# Patient Record
Sex: Female | Born: 1945 | Race: White | Hispanic: No | Marital: Married | Smoking: Never smoker
Health system: Southern US, Community
[De-identification: ages and names within clinical notes are randomized; demographics above are authoritative.]

## PROBLEM LIST (undated history)

## (undated) DIAGNOSIS — Z86010 Personal history of colonic polyps: Secondary | ICD-10-CM

## (undated) HISTORY — PX: ABDOMINAL HYSTERECTOMY: SHX81

## (undated) HISTORY — DX: Personal history of colonic polyps: Z86.010

---

## 1998-09-10 ENCOUNTER — Other Ambulatory Visit: Admission: RE | Admit: 1998-09-10 | Discharge: 1998-09-10 | Payer: Self-pay | Admitting: Obstetrics and Gynecology

## 1999-12-03 ENCOUNTER — Other Ambulatory Visit: Admission: RE | Admit: 1999-12-03 | Discharge: 1999-12-03 | Payer: Self-pay | Admitting: Obstetrics and Gynecology

## 2001-03-14 ENCOUNTER — Other Ambulatory Visit: Admission: RE | Admit: 2001-03-14 | Discharge: 2001-03-14 | Payer: Self-pay | Admitting: Obstetrics and Gynecology

## 2004-08-31 ENCOUNTER — Ambulatory Visit: Payer: Self-pay | Admitting: Internal Medicine

## 2004-09-07 ENCOUNTER — Ambulatory Visit: Payer: Self-pay | Admitting: Internal Medicine

## 2006-04-18 ENCOUNTER — Ambulatory Visit: Payer: Self-pay | Admitting: Internal Medicine

## 2006-07-18 ENCOUNTER — Ambulatory Visit: Payer: Self-pay | Admitting: Internal Medicine

## 2007-01-16 ENCOUNTER — Ambulatory Visit: Payer: Self-pay | Admitting: Internal Medicine

## 2007-01-16 LAB — CONVERTED CEMR LAB
ALT: 17 units/L (ref 0–35)
AST: 20 units/L (ref 0–37)
Albumin: 3.5 g/dL (ref 3.5–5.2)
Alkaline Phosphatase: 76 units/L (ref 39–117)
BUN: 15 mg/dL (ref 6–23)
Basophils Absolute: 0 10*3/uL (ref 0.0–0.1)
Basophils Relative: 0.1 % (ref 0.0–1.0)
Bilirubin Urine: NEGATIVE
Blood in Urine, dipstick: NEGATIVE
CO2: 29 meq/L (ref 19–32)
Calcium: 9.3 mg/dL (ref 8.4–10.5)
Chloride: 107 meq/L (ref 96–112)
Cholesterol: 212 mg/dL (ref 0–200)
Creatinine, Ser: 0.9 mg/dL (ref 0.4–1.2)
Hemoglobin: 13.6 g/dL (ref 12.0–15.0)
MCHC: 35.9 g/dL (ref 30.0–36.0)
Monocytes Absolute: 0.5 10*3/uL (ref 0.2–0.7)
Monocytes Relative: 8.3 % (ref 3.0–11.0)
Potassium: 4.8 meq/L (ref 3.5–5.1)
Protein, U semiquant: NEGATIVE
RBC: 4.34 M/uL (ref 3.87–5.11)
RDW: 12.1 % (ref 11.5–14.6)
Specific Gravity, Urine: >=1.03
TSH: 3.82 microintl units/mL (ref 0.35–5.50)
Total Bilirubin: 0.6 mg/dL (ref 0.3–1.2)
Total CHOL/HDL Ratio: 4.2
Total Protein: 7 g/dL (ref 6.0–8.3)
Triglycerides: 139 mg/dL (ref 0–149)
Urobilinogen, UA: 0.2
VLDL: 28 mg/dL (ref 0–40)
WBC Urine, dipstick: NEGATIVE

## 2007-02-01 ENCOUNTER — Ambulatory Visit: Payer: Self-pay | Admitting: Internal Medicine

## 2007-02-01 DIAGNOSIS — Z860100 Personal history of colon polyps, unspecified: Secondary | ICD-10-CM

## 2007-02-01 HISTORY — DX: Personal history of colonic polyps: Z86.010

## 2007-02-01 HISTORY — DX: Personal history of colon polyps, unspecified: Z86.0100

## 2007-03-27 ENCOUNTER — Encounter: Payer: Self-pay | Admitting: Internal Medicine

## 2008-02-01 ENCOUNTER — Ambulatory Visit: Payer: Self-pay | Admitting: Internal Medicine

## 2008-02-01 LAB — CONVERTED CEMR LAB
Albumin: 3.5 g/dL (ref 3.5–5.2)
Bilirubin, Direct: 0.1 mg/dL (ref 0.0–0.3)
Calcium: 9.6 mg/dL (ref 8.4–10.5)
Direct LDL: 114.5 mg/dL
Eosinophils Absolute: 0.1 10*3/uL (ref 0.0–0.7)
GFR calc Af Amer: 82 mL/min
GFR calc non Af Amer: 67 mL/min
Glucose, Bld: 92 mg/dL (ref 70–99)
HCT: 39.1 % (ref 36.0–46.0)
HDL: 56.2 mg/dL (ref >39.0)
Hemoglobin: 13.7 g/dL (ref 12.0–15.0)
MCHC: 35.1 g/dL (ref 30.0–36.0)
MCV: 89.5 fL (ref 78.0–100.0)
Monocytes Absolute: 0.5 10*3/uL (ref 0.1–1.0)
Monocytes Relative: 7.2 % (ref 3.0–12.0)
Neutro Abs: 4.4 10*3/uL (ref 1.4–7.7)
Platelets: 216 10*3/uL (ref 150–400)
Potassium: 4.6 meq/L (ref 3.5–5.1)
RDW: 12.2 % (ref 11.5–14.6)
Sodium: 143 meq/L (ref 135–145)
TSH: 3.27 microintl units/mL (ref 0.35–5.50)
Total Protein: 7 g/dL (ref 6.0–8.3)

## 2008-08-05 ENCOUNTER — Encounter: Payer: Self-pay | Admitting: Internal Medicine

## 2008-08-06 ENCOUNTER — Encounter: Payer: Self-pay | Admitting: Internal Medicine

## 2008-08-06 DIAGNOSIS — S82899A Other fracture of unspecified lower leg, initial encounter for closed fracture: Secondary | ICD-10-CM | POA: Insufficient documentation

## 2009-01-24 ENCOUNTER — Ambulatory Visit: Payer: Self-pay | Admitting: Internal Medicine

## 2009-01-24 LAB — CONVERTED CEMR LAB
ALT: 16 units/L (ref 0–35)
Albumin: 3.5 g/dL (ref 3.5–5.2)
Basophils Relative: 0 % (ref 0.0–3.0)
Bilirubin, Direct: 0.1 mg/dL (ref 0.0–0.3)
CO2: 26 meq/L (ref 19–32)
Chloride: 108 meq/L (ref 96–112)
Eosinophils Relative: 1.6 % (ref 0.0–5.0)
HCT: 39.9 % (ref 36.0–46.0)
Hemoglobin: 13.6 g/dL (ref 12.0–15.0)
LDL Cholesterol: 116 mg/dL — ABNORMAL HIGH (ref 0–99)
Lymphs Abs: 1.9 10*3/uL (ref 0.7–4.0)
MCV: 92.7 fL (ref 78.0–100.0)
Monocytes Absolute: 0.3 10*3/uL (ref 0.1–1.0)
Neutro Abs: 3.5 10*3/uL (ref 1.4–7.7)
Neutrophils Relative %: 59.6 % (ref 43.0–77.0)
Nitrite: NEGATIVE
Potassium: 4.4 meq/L (ref 3.5–5.1)
RBC: 4.3 M/uL (ref 3.87–5.11)
Sodium: 142 meq/L (ref 135–145)
Specific Gravity, Urine: 1.025
Total CHOL/HDL Ratio: 3
Total Protein: 7 g/dL (ref 6.0–8.3)
Urobilinogen, UA: 0.2
WBC: 5.8 10*3/uL (ref 4.5–10.5)

## 2009-02-03 ENCOUNTER — Ambulatory Visit: Payer: Self-pay | Admitting: Internal Medicine

## 2009-02-12 ENCOUNTER — Encounter (INDEPENDENT_AMBULATORY_CARE_PROVIDER_SITE_OTHER): Payer: Self-pay | Admitting: *Deleted

## 2010-02-13 ENCOUNTER — Encounter: Payer: Self-pay | Admitting: Internal Medicine

## 2010-04-28 ENCOUNTER — Encounter: Payer: Self-pay | Admitting: Internal Medicine

## 2010-04-28 ENCOUNTER — Ambulatory Visit (INDEPENDENT_AMBULATORY_CARE_PROVIDER_SITE_OTHER): Payer: PRIVATE HEALTH INSURANCE | Admitting: Internal Medicine

## 2010-04-28 VITALS — BP 140/90 | Temp 98.2°F | Ht 63.5 in | Wt 170.0 lb

## 2010-04-28 DIAGNOSIS — J209 Acute bronchitis, unspecified: Secondary | ICD-10-CM

## 2010-04-28 MED ORDER — HYDROCODONE-HOMATROPINE 5-1.5 MG/5ML PO SYRP: 5.0000 mL | ORAL_SOLUTION | Freq: Four times a day (QID) | ORAL | Status: AC | PRN

## 2010-04-28 MED ORDER — DOXYCYCLINE HYCLATE 100 MG PO TABS: 100.0000 mg | ORAL_TABLET | Freq: Two times a day (BID) | ORAL | Status: AC

## 2010-04-28 NOTE — Progress Notes (Signed)
  Subjective:    Patient ID: Susan Lester, female    DOB: Jun 04, 1945, 65 y.o.   MRN: [id number]  HPI  65 year old patient who presented with a 3 week history of sinus and chest congestion.  She is having more productive cough yielding yellow sputum.  No definite fever, chills, or shortness of breath.  Symptoms have worsened steadily over the past 3 weeks.  Cough is interfering with sleep.  Her husband has a similar illness and is on antibiotic therapy    Review of Systems  Constitutional: Positive for fatigue.  HENT: Positive for congestion. Negative for hearing loss, sore throat, rhinorrhea, dental problem, sinus pressure and tinnitus.   Eyes: Negative for pain, discharge and visual disturbance.  Respiratory: Positive for cough. Negative for shortness of breath.   Cardiovascular: Negative for chest pain, palpitations and leg swelling.  Gastrointestinal: Negative for nausea, vomiting, abdominal pain, diarrhea, constipation, blood in stool and abdominal distention.  Genitourinary: Negative for dysuria, urgency, frequency, hematuria, flank pain, vaginal bleeding, vaginal discharge, difficulty urinating, vaginal pain and pelvic pain.  Musculoskeletal: Negative for joint swelling, arthralgias and gait problem.  Skin: Negative for rash.  Neurological: Negative for dizziness, syncope, speech difficulty, weakness, numbness and headaches.  Hematological: Negative for adenopathy.  Psychiatric/Behavioral: Negative for behavioral problems, dysphoric mood and agitation. The patient is not nervous/anxious.        Objective:   Physical Exam  Constitutional: She is oriented to person, place, and time. She appears well-developed and well-nourished. No distress.  HENT:  Head: Normocephalic.  Right Ear: External ear normal.  Left Ear: External ear normal.  Nose: Nose normal.  Mouth/Throat: Oropharynx is clear and moist.  Eyes: Conjunctivae and EOM are normal. Pupils are equal, round, and reactive to  light.  Neck: Normal range of motion. Neck supple. No thyromegaly present.  Cardiovascular: Normal rate, regular rhythm, normal heart sounds and intact distal pulses.   Pulmonary/Chest: Effort normal and breath sounds normal. No respiratory distress. She has no wheezes.       Scattered coarse rhonchi bilaterally  Abdominal: Soft. Bowel sounds are normal. She exhibits no mass. There is no tenderness.  Musculoskeletal: Normal range of motion.  Lymphadenopathy:    She has no cervical adenopathy.  Neurological: She is alert and oriented to person, place, and time.  Skin: Skin is warm and dry. No rash noted.  Psychiatric: She has a normal mood and affect. Her behavior is normal.          Assessment & Plan:  Acute bronchitis

## 2010-04-28 NOTE — Patient Instructions (Signed)
Get plenty of rest, Drink lots of  clear liquids, and use Tylenol or ibuprofen for fever and discomfort.    Call or return to clinic prn if these symptoms worsen or fail to improve as anticipated.  

## 2010-06-30 ENCOUNTER — Encounter: Payer: Self-pay | Admitting: Internal Medicine

## 2011-03-03 LAB — HM DEXA SCAN

## 2011-03-03 LAB — HM MAMMOGRAPHY

## 2011-03-08 ENCOUNTER — Encounter: Payer: Self-pay | Admitting: Internal Medicine

## 2011-03-22 ENCOUNTER — Encounter: Payer: Self-pay | Admitting: Internal Medicine

## 2011-08-03 ENCOUNTER — Other Ambulatory Visit (INDEPENDENT_AMBULATORY_CARE_PROVIDER_SITE_OTHER): Payer: Medicare Other

## 2011-08-03 DIAGNOSIS — Z0000 Encounter for general adult medical examination without abnormal findings: Secondary | ICD-10-CM

## 2011-08-03 DIAGNOSIS — Z79899 Other long term (current) drug therapy: Secondary | ICD-10-CM

## 2011-08-03 LAB — CBC WITH DIFFERENTIAL/PLATELET
Basophils Absolute: 0 10*3/uL (ref 0.0–0.1)
Eosinophils Absolute: 0.1 10*3/uL (ref 0.0–0.7)
HCT: 41 % (ref 36.0–46.0)
Hemoglobin: 13.6 g/dL (ref 12.0–15.0)
Lymphs Abs: 2 10*3/uL (ref 0.7–4.0)
MCHC: 33.1 g/dL (ref 30.0–36.0)
Neutro Abs: 4.2 10*3/uL (ref 1.4–7.7)
RDW: 13.9 % (ref 11.5–14.6)

## 2011-08-03 LAB — TSH: TSH: 3.84 u[IU]/mL (ref 0.35–5.50)

## 2011-08-03 LAB — POCT URINALYSIS DIPSTICK
Bilirubin, UA: NEGATIVE
Ketones, UA: NEGATIVE
Protein, UA: NEGATIVE
Spec Grav, UA: 1.025

## 2011-08-03 LAB — BASIC METABOLIC PANEL
CO2: 26 mEq/L (ref 19–32)
Glucose, Bld: 71 mg/dL (ref 70–99)
Potassium: 4.2 mEq/L (ref 3.5–5.1)

## 2011-08-03 LAB — LIPID PANEL: HDL: 62.9 mg/dL (ref >39.00)

## 2011-08-03 LAB — HEPATIC FUNCTION PANEL
AST: 16 U/L (ref 0–37)
Albumin: 3.6 g/dL (ref 3.5–5.2)

## 2011-08-03 LAB — LDL CHOLESTEROL, DIRECT: Direct LDL: 132.8 mg/dL

## 2011-08-13 ENCOUNTER — Ambulatory Visit (INDEPENDENT_AMBULATORY_CARE_PROVIDER_SITE_OTHER): Payer: Medicare Other | Admitting: Internal Medicine

## 2011-08-13 ENCOUNTER — Encounter: Payer: Self-pay | Admitting: Internal Medicine

## 2011-08-13 VITALS — BP 132/82 | HR 66 | Temp 97.8°F | Resp 20 | Ht 63.5 in | Wt 178.0 lb

## 2011-08-13 DIAGNOSIS — Z86010 Personal history of colonic polyps: Secondary | ICD-10-CM

## 2011-08-13 DIAGNOSIS — M722 Plantar fascial fibromatosis: Secondary | ICD-10-CM

## 2011-08-13 DIAGNOSIS — Z0000 Encounter for general adult medical examination without abnormal findings: Secondary | ICD-10-CM

## 2011-08-13 DIAGNOSIS — N951 Menopausal and female climacteric states: Secondary | ICD-10-CM

## 2011-08-13 DIAGNOSIS — E669 Obesity, unspecified: Secondary | ICD-10-CM

## 2011-08-13 NOTE — Progress Notes (Signed)
Subjective:    Patient ID: Susan Lester, female    DOB: [birth year], 65 y.o.   MRN: [id number]  HPI  66 year old patient who is seen today for a preventive health examination. She is followed by gynecology and GI. She has a history of colonic polyps and menopausal syndrome. She is also followed by podiatry do to plantar fasciitis in general she is doing quite well. No concerns or complaints today.  Past Medical History  Diagnosis Date  . COLONIC POLYPS, HX OF 02/01/2007    History   Social History  . Marital Status: Married    Spouse Name: N/A    Number of Children: N/A  . Years of Education: N/A   Occupational History  . Not on file.   Social History Main Topics  . Smoking status: Never Smoker   . Smokeless tobacco: Not on file  . Alcohol Use: Not on file  . Drug Use: Not on file  . Sexually Active: Not on file   Other Topics Concern  . Not on file   Social History Narrative  . No narrative on file    Past Surgical History  Procedure Date  . Abdominal hysterectomy     Family History  Problem Relation Age of Onset  . Heart disease Mother   . Hypertension Mother   . Cancer Father     lung ca  . COPD Father     No Known Allergies  Current Outpatient Prescriptions on File Prior to Visit  Medication Sig Dispense Refill  . estradiol (ESTRACE) 1 MG tablet         BP 132/82  Pulse 66  Temp(Src) 97.8 F (36.6 C) (Oral)  Resp 20  Ht 5' 3.5" (1.613 m)  Wt 178 lb (80.74 kg)  BMI 31.04 kg/m2  SpO2 98%  1. Risk factors, based on past  M,S,F history-  no significant cardiovascular risk factors  2.  Physical activities: Fairly sedentary. No exercise limitations but presently hampered by plantar fasciitis  3.  Depression/mood: No history depression or mood disorder  4.  Hearing: No deficits  5.  ADL's: Independent in all aspects of daily living  6.  Fall risk: Low  7.  Home safety: No problems identified  8.  Height weight, and visual acuity; height  and weight stable no change in visual acuity 9.  Counseling:  10. Lab orders based on risk factors: Laboratory profile reviewed and discussed  11. Referral : Not appropriate at this time. Has had a mammogram and bone density study within the past year. Is followed by gynecology annually  12. Care plan:  Low-salt low-calorie diet regular exercise and weight loss all encouraged  13. Cognitive assessment: Alert and oriented normal affect. No cognitive dysfunction        Review of Systems  Constitutional: Negative for fever, appetite change, fatigue and unexpected weight change.  HENT: Negative for hearing loss, ear pain, nosebleeds, congestion, sore throat, mouth sores, trouble swallowing, neck stiffness, dental problem, voice change, sinus pressure and tinnitus.   Eyes: Negative for photophobia, pain, redness and visual disturbance.  Respiratory: Negative for cough, chest tightness and shortness of breath.   Cardiovascular: Negative for chest pain, palpitations and leg swelling.  Gastrointestinal: Negative for nausea, vomiting, abdominal pain, diarrhea, constipation, blood in stool, abdominal distention and rectal pain.  Genitourinary: Negative for dysuria, urgency, frequency, hematuria, flank pain, vaginal bleeding, vaginal discharge, difficulty urinating, genital sores, vaginal pain, menstrual problem and pelvic pain.  Musculoskeletal: Negative for back  pain and arthralgias (Foot pain).  Skin: Negative for rash.  Neurological: Negative for dizziness, syncope, speech difficulty, weakness, light-headedness, numbness and headaches.  Hematological: Negative for adenopathy. Does not bruise/bleed easily.  Psychiatric/Behavioral: Negative for suicidal ideas, behavioral problems, self-injury, dysphoric mood and agitation. The patient is not nervous/anxious.        Objective:   Physical Exam  Constitutional: She is oriented to person, place, and time. She appears well-developed and  well-nourished.       Overweight Blood pressure 130/80  HENT:  Head: Normocephalic and atraumatic.  Right Ear: External ear normal.  Left Ear: External ear normal.  Mouth/Throat: Oropharynx is clear and moist.  Eyes: Conjunctivae and EOM are normal.  Neck: Normal range of motion. Neck supple. No JVD present. No thyromegaly present.  Cardiovascular: Normal rate, regular rhythm, normal heart sounds and intact distal pulses.   No murmur heard. Pulmonary/Chest: Effort normal and breath sounds normal. She has no wheezes. She has no rales.  Abdominal: Soft. Bowel sounds are normal. She exhibits no distension and no mass. There is no tenderness. There is no rebound and no guarding.  Musculoskeletal: Normal range of motion. She exhibits no edema and no tenderness.  Neurological: She is alert and oriented to person, place, and time. She has normal reflexes. No cranial nerve deficit. She exhibits normal muscle tone. Coordination normal.  Skin: Skin is warm and dry. No rash noted.  Psychiatric: She has a normal mood and affect. Her behavior is normal.          Assessment & Plan:   Preventive health examination    Preventive health examination  Exogenous obesity Menopausal syndrome Plantar fasciitis  Exercise weight loss encouraged Recheck one year

## 2011-08-13 NOTE — Patient Instructions (Signed)
It is important that you exercise regularly, at least 20 minutes 3 to 4 times per week.  If you develop chest pain or shortness of breath seek  medical attention.  You need to lose weight.  Consider a lower calorie diet and regular exercise.  Return in one year for follow-up DASH Diet The DASH diet stands for "Dietary Approaches to Stop Hypertension." It is a healthy eating plan that has been shown to reduce high blood pressure (hypertension) in as little as 14 days, while also possibly providing other significant health benefits. These other health benefits include reducing the risk of breast cancer after menopause and reducing the risk of type 2 diabetes, heart disease, colon cancer, and stroke. Health benefits also include weight loss and slowing kidney failure in patients with chronic kidney disease.   DIET GUIDELINES  Limit salt (sodium). Your diet should contain less than 1500 mg of sodium daily.   Limit refined or processed carbohydrates. Your diet should include mostly whole grains. Desserts and added sugars should be used sparingly.   Include small amounts of heart-healthy fats. These types of fats include nuts, oils, and tub margarine. Limit saturated and trans fats. These fats have been shown to be harmful in the body.  CHOOSING FOODS   The following food groups are based on a 2000 calorie diet. See your Registered Dietitian for individual calorie needs. Grains and Grain Products (6 to 8 servings daily)  Eat More Often: Whole-wheat bread, brown rice, whole-grain or wheat pasta, quinoa, popcorn without added fat or salt (air popped).   Eat Less Often: White bread, white pasta, white rice, cornbread.  Vegetables (4 to 5 servings daily)  Eat More Often: Fresh, frozen, and canned vegetables. Vegetables may be raw, steamed, roasted, or grilled with a minimal amount of fat.   Eat Less Often/Avoid: Creamed or fried vegetables. Vegetables in a cheese sauce.  Fruit (4 to 5 servings  daily)  Eat More Often: All fresh, canned (in natural juice), or frozen fruits. Dried fruits without added sugar. One hundred percent fruit juice ( cup [237 mL] daily).   Eat Less Often: Dried fruits with added sugar. Canned fruit in light or heavy syrup.  [employer], Fish, and Poultry (2 servings or less daily. One serving is 3 to 4 oz [85-114 g]).  Eat More Often: Ninety percent or leaner ground beef, tenderloin, sirloin. Round cuts of beef, chicken breast, [place] breast. All fish. Grill, bake, or broil your meat. Nothing should be fried.   Eat Less Often/Avoid: Fatty cuts of meat, [place], or chicken leg, thigh, or wing. Fried cuts of meat or fish.  Dairy (2 to 3 servings)  Eat More Often: Low-fat or fat-free milk, low-fat plain or light yogurt, reduced-fat or part-skim cheese.   Eat Less Often/Avoid: Milk (whole, 2%, skim, or chocolate). Whole milk yogurt. Full-fat cheeses.  Nuts, Seeds, and Legumes (4 to 5 servings per week)  Eat More Often: All without added salt.   Eat Less Often/Avoid: Salted nuts and seeds, canned beans with added salt.  Fats and Sweets (limited)  Eat More Often: Vegetable oils, tub margarines without trans fats, sugar-free gelatin. Mayonnaise and salad dressings.   Eat Less Often/Avoid: Coconut oils, palm oils, butter, stick margarine, cream, half and half, cookies, candy, pie.  FOR MORE INFORMATION The Dash Diet Eating Plan: www.dashdiet.org Document Released: 02/18/2011 Document Reviewed: 02/08/2011 [facility] Patient Information 2012 [place], [place].

## 2012-08-08 ENCOUNTER — Other Ambulatory Visit: Payer: Medicare Other

## 2012-08-14 ENCOUNTER — Encounter: Payer: Medicare Other | Admitting: Internal Medicine

## 2012-09-05 ENCOUNTER — Encounter: Payer: Self-pay | Admitting: Internal Medicine

## 2012-09-05 ENCOUNTER — Ambulatory Visit (INDEPENDENT_AMBULATORY_CARE_PROVIDER_SITE_OTHER): Payer: Medicare Other | Admitting: Internal Medicine

## 2012-09-05 VITALS — BP 140/86 | HR 63 | Temp 97.8°F | Resp 20 | Ht 63.25 in | Wt 187.0 lb

## 2012-09-05 DIAGNOSIS — Z23 Encounter for immunization: Secondary | ICD-10-CM

## 2012-09-05 DIAGNOSIS — Z860100 Personal history of colon polyps, unspecified: Secondary | ICD-10-CM

## 2012-09-05 DIAGNOSIS — Z0000 Encounter for general adult medical examination without abnormal findings: Secondary | ICD-10-CM

## 2012-09-05 LAB — CBC WITH DIFFERENTIAL/PLATELET
Basophils Relative: 0.3 % (ref 0.0–3.0)
Eosinophils Relative: 1.8 % (ref 0.0–5.0)
HCT: 39.4 % (ref 36.0–46.0)
MCV: 88.8 fl (ref 78.0–100.0)
Monocytes Absolute: 0.4 10*3/uL (ref 0.1–1.0)
Monocytes Relative: 5.4 % (ref 3.0–12.0)
Neutrophils Relative %: 65.3 % (ref 43.0–77.0)
RBC: 4.44 Mil/uL (ref 3.87–5.11)
WBC: 6.9 10*3/uL (ref 4.5–10.5)

## 2012-09-05 LAB — COMPREHENSIVE METABOLIC PANEL
Albumin: 3.7 g/dL (ref 3.5–5.2)
Alkaline Phosphatase: 60 U/L (ref 39–117)
BUN: 14 mg/dL (ref 6–23)
CO2: 27 mEq/L (ref 19–32)
GFR: 62.28 mL/min (ref >60.00)
Glucose, Bld: 116 mg/dL — ABNORMAL HIGH (ref 70–99)
Total Bilirubin: 0.8 mg/dL (ref 0.3–1.2)

## 2012-09-05 LAB — TSH: TSH: 3.5 u[IU]/mL (ref 0.35–5.50)

## 2012-09-05 LAB — LIPID PANEL
Cholesterol: 192 mg/dL (ref 0–200)
HDL: 63.7 mg/dL (ref >39.00)
VLDL: 26 mg/dL (ref 0.0–40.0)

## 2012-09-05 NOTE — Progress Notes (Signed)
Patient ID: Susan Lester, female   DOB: [birth year], 67 y.o.   MRN: [id number]  Subjective:    Patient ID: Susan Lester, female    DOB: 1945/10/15, 67 y.o.   MRN: [id number]  HPI  67 year old patient who is seen today for a preventive health examination. She is followed by gynecology and GI. She has a history of colonic polyps and menopausal syndrome.  In general she is doing quite well. No concerns or complaints   Wt Readings from Last 3 Encounters:  09/05/12 187 lb (84.823 kg)  08/13/11 178 lb (80.74 kg)  04/28/10 170 lb (77.111 kg)    Allergies:  No Known Drug Allergies   Past History:  Past Medical History:  Reviewed history from 02/01/2007 and no changes required.  menopausal syndrome  Colonic polyps, hx of   Past Surgical History:  Hysterectomy  gravida two, para two, aborta zero  colonoscopy 1/09  2012 ([facility])  Family History:  Reviewed history from 02/01/2007 and no changes required.  father died age 37, lung cancer, history of COPD  mother died age 50, coronary artery disease, history, hypertension  3 brothers, one accidental death   Social History:  Reviewed history from 02/01/2007 and no changes required.  Married      Past Medical History  Diagnosis Date  . COLONIC POLYPS, HX OF 02/01/2007    History   Social History  . Marital Status: Married    Spouse Name: N/A    Number of Children: N/A  . Years of Education: N/A   Occupational History  . Not on file.   Social History Main Topics  . Smoking status: Never Smoker   . Smokeless tobacco: Not on file  . Alcohol Use: Not on file  . Drug Use: Not on file  . Sexually Active: Not on file   Other Topics Concern  . Not on file   Social History Narrative  . No narrative on file    Past Surgical History  Procedure Laterality Date  . Abdominal hysterectomy      Family History  Problem Relation Age of Onset  . Heart disease Mother   . Hypertension Mother   . Cancer Father     lung ca   . COPD Father     No Known Allergies  Current Outpatient Prescriptions on File Prior to Visit  Medication Sig Dispense Refill  . estradiol (ESTRACE) 1 MG tablet        No current facility-administered medications on file prior to visit.    There were no vitals taken for this visit.  1. Risk factors, based on past  M,S,F history-  no significant cardiovascular risk factors  2.  Physical activities: Fairly sedentary. No exercise limitations but presently hampered by plantar fasciitis  3.  Depression/mood: No history depression or mood disorder  4.  Hearing: No deficits  5.  ADL's: Independent in all aspects of daily living  6.  Fall risk: Low  7.  Home safety: No problems identified  8.  Height weight, and visual acuity; weight continues to increase;  no change in visual acuity 9.  Counseling: Regular exercise weight loss all encouraged  10. Lab orders based on risk factors: Laboratory profile reviewed and discussed  11. Referral : Not appropriate at this time. Has had a mammogram and bone density study within the past year. Is followed by gynecology annually  12. Care plan:  Low-salt low-calorie diet regular exercise and weight loss all encouraged  13. Cognitive  assessment: Alert and oriented normal affect. No cognitive dysfunction        Review of Systems  Constitutional: Negative for fever, appetite change, fatigue and unexpected weight change.  HENT: Negative for hearing loss, ear pain, nosebleeds, congestion, sore throat, mouth sores, trouble swallowing, neck stiffness, dental problem, voice change, sinus pressure and tinnitus.   Eyes: Negative for photophobia, pain, redness and visual disturbance.  Respiratory: Negative for cough, chest tightness and shortness of breath.   Cardiovascular: Negative for chest pain, palpitations and leg swelling.  Gastrointestinal: Negative for nausea, vomiting, abdominal pain, diarrhea, constipation, blood in stool, abdominal  distention and rectal pain.  Genitourinary: Negative for dysuria, urgency, frequency, hematuria, flank pain, vaginal bleeding, vaginal discharge, difficulty urinating, genital sores, vaginal pain, menstrual problem and pelvic pain.  Musculoskeletal: Negative for back pain and arthralgias (Foot pain).  Skin: Negative for rash.  Neurological: Negative for dizziness, syncope, speech difficulty, weakness, light-headedness, numbness and headaches.  Hematological: Negative for adenopathy. Does not bruise/bleed easily.  Psychiatric/Behavioral: Negative for suicidal ideas, behavioral problems, self-injury, dysphoric mood and agitation. The patient is not nervous/anxious.        Objective:   Physical Exam  Constitutional: She is oriented to person, place, and time. She appears well-developed and well-nourished.  Overweight Blood pressure 130/80  HENT:  Head: Normocephalic and atraumatic.  Right Ear: External ear normal.  Left Ear: External ear normal.  Mouth/Throat: Oropharynx is clear and moist.  Eyes: Conjunctivae and EOM are normal.  Neck: Normal range of motion. Neck supple. No JVD present. No thyromegaly present.  Cardiovascular: Normal rate, regular rhythm, normal heart sounds and intact distal pulses.   No murmur heard. Pulmonary/Chest: Effort normal and breath sounds normal. She has no wheezes. She has no rales.  Abdominal: Soft. Bowel sounds are normal. She exhibits no distension and no mass. There is no tenderness. There is no rebound and no guarding.  Musculoskeletal: Normal range of motion. She exhibits no edema and no tenderness.  Neurological: She is alert and oriented to person, place, and time. She has normal reflexes. No cranial nerve deficit. She exhibits normal muscle tone. Coordination normal.  Skin: Skin is warm and dry. No rash noted.  Psychiatric: She has a normal mood and affect. Her behavior is normal.          Assessment & Plan:   Preventive health examination     Preventive health examination  Exogenous obesity with weight gain Menopausal syndrome   Exercise weight loss encouraged; dietary referral offered Recheck one year

## 2012-09-05 NOTE — Patient Instructions (Signed)
It is important that you exercise regularly, at least 20 minutes 3 to 4 times per week.  If you develop chest pain or shortness of breath seek  medical attention.  You need to lose weight.  Consider a lower calorie diet and regular exercise.DASH Diet The DASH diet stands for "Dietary Approaches to Stop Hypertension." It is a healthy eating plan that has been shown to reduce high blood pressure (hypertension) in as little as 14 days, while also possibly providing other significant health benefits. These other health benefits include reducing the risk of breast cancer after menopause and reducing the risk of type 2 diabetes, heart disease, colon cancer, and stroke. Health benefits also include weight loss and slowing kidney failure in patients with chronic kidney disease.  DIET GUIDELINES  Limit salt (sodium). Your diet should contain less than 1500 mg of sodium daily.  Limit refined or processed carbohydrates. Your diet should include mostly whole grains. Desserts and added sugars should be used sparingly.  Include small amounts of heart-healthy fats. These types of fats include nuts, oils, and tub margarine. Limit saturated and trans fats. These fats have been shown to be harmful in the body. CHOOSING FOODS  The following food groups are based on a 2000 calorie diet. See your Registered Dietitian for individual calorie needs. Grains and Grain Products (6 to 8 servings daily)  Eat More Often: Whole-wheat bread, brown rice, whole-grain or wheat pasta, quinoa, popcorn without added fat or salt (air popped).  Eat Less Often: White bread, white pasta, white rice, cornbread. Vegetables (4 to 5 servings daily)  Eat More Often: Fresh, frozen, and canned vegetables. Vegetables may be raw, steamed, roasted, or grilled with a minimal amount of fat.  Eat Less Often/Avoid: Creamed or fried vegetables. Vegetables in a cheese sauce. Fruit (4 to 5 servings daily)  Eat More Often: All fresh, canned (in  natural juice), or frozen fruits. Dried fruits without added sugar. One hundred percent fruit juice ( cup [237 mL] daily).  Eat Less Often: Dried fruits with added sugar. Canned fruit in light or heavy syrup. [employer], Fish, and Poultry (2 servings or less daily. One serving is 3 to 4 oz [85-114 g]).  Eat More Often: Ninety percent or leaner ground beef, tenderloin, sirloin. Round cuts of beef, chicken breast, [place] breast. All fish. Grill, bake, or broil your meat. Nothing should be fried.  Eat Less Often/Avoid: Fatty cuts of meat, [place], or chicken leg, thigh, or wing. Fried cuts of meat or fish. Dairy (2 to 3 servings)  Eat More Often: Low-fat or fat-free milk, low-fat plain or light yogurt, reduced-fat or part-skim cheese.  Eat Less Often/Avoid: Milk (whole, 2%).Whole milk yogurt. Full-fat cheeses. Nuts, Seeds, and Legumes (4 to 5 servings per week)  Eat More Often: All without added salt.  Eat Less Often/Avoid: Salted nuts and seeds, canned beans with added salt. Fats and Sweets (limited)  Eat More Often: Vegetable oils, tub margarines without trans fats, sugar-free gelatin. Mayonnaise and salad dressings.  Eat Less Often/Avoid: Coconut oils, palm oils, butter, stick margarine, cream, half and half, cookies, candy, pie. FOR MORE INFORMATION The Dash Diet Eating Plan: www.dashdiet.org Document Released: 02/18/2011 Document Revised: 05/24/2011 Document Reviewed: 02/18/2011 [facility] Patient Information 2014 [place], [place].

## 2013-09-05 ENCOUNTER — Ambulatory Visit (INDEPENDENT_AMBULATORY_CARE_PROVIDER_SITE_OTHER): Payer: Medicare Other | Admitting: Internal Medicine

## 2013-09-05 ENCOUNTER — Encounter: Payer: Self-pay | Admitting: Internal Medicine

## 2013-09-05 VITALS — BP 140/86 | HR 61 | Temp 97.9°F | Resp 20 | Ht 63.5 in | Wt 173.0 lb

## 2013-09-05 DIAGNOSIS — Z0000 Encounter for general adult medical examination without abnormal findings: Secondary | ICD-10-CM

## 2013-09-05 DIAGNOSIS — Z86010 Personal history of colonic polyps: Secondary | ICD-10-CM

## 2013-09-05 DIAGNOSIS — E669 Obesity, unspecified: Secondary | ICD-10-CM

## 2013-09-05 DIAGNOSIS — N951 Menopausal and female climacteric states: Secondary | ICD-10-CM

## 2013-09-05 DIAGNOSIS — Z23 Encounter for immunization: Secondary | ICD-10-CM

## 2013-09-05 LAB — CBC WITH DIFFERENTIAL/PLATELET
BASOS PCT: 0.3 % (ref 0.0–3.0)
Basophils Absolute: 0 10*3/uL (ref 0.0–0.1)
EOS ABS: 0.1 10*3/uL (ref 0.0–0.7)
Eosinophils Relative: 2.1 % (ref 0.0–5.0)
HEMATOCRIT: 40.5 % (ref 36.0–46.0)
HEMOGLOBIN: 13.5 g/dL (ref 12.0–15.0)
LYMPHS ABS: 2.2 10*3/uL (ref 0.7–4.0)
Lymphocytes Relative: 32.4 % (ref 12.0–46.0)
MCHC: 33.4 g/dL (ref 30.0–36.0)
MCV: 89.2 fl (ref 78.0–100.0)
MONO ABS: 0.4 10*3/uL (ref 0.1–1.0)
Monocytes Relative: 6.1 % (ref 3.0–12.0)
NEUTROS ABS: 4.1 10*3/uL (ref 1.4–7.7)
Neutrophils Relative %: 59.1 % (ref 43.0–77.0)
Platelets: 219 10*3/uL (ref 150.0–400.0)
RBC: 4.54 Mil/uL (ref 3.87–5.11)
RDW: 13.3 % (ref 11.5–15.5)
WBC: 6.9 10*3/uL (ref 4.0–10.5)

## 2013-09-05 LAB — COMPREHENSIVE METABOLIC PANEL
ALT: 13 U/L (ref 0–35)
AST: 18 U/L (ref 0–37)
Albumin: 3.8 g/dL (ref 3.5–5.2)
Alkaline Phosphatase: 66 U/L (ref 39–117)
BILIRUBIN TOTAL: 0.7 mg/dL (ref 0.2–1.2)
BUN: 18 mg/dL (ref 6–23)
CO2: 28 mEq/L (ref 19–32)
CREATININE: 1 mg/dL (ref 0.4–1.2)
Calcium: 9 mg/dL (ref 8.4–10.5)
Chloride: 105 mEq/L (ref 96–112)
GFR: 59.91 mL/min — ABNORMAL LOW (ref >60.00)
Glucose, Bld: 107 mg/dL — ABNORMAL HIGH (ref 70–99)
Potassium: 4.5 mEq/L (ref 3.5–5.1)
Sodium: 139 mEq/L (ref 135–145)
Total Protein: 7.2 g/dL (ref 6.0–8.3)

## 2013-09-05 LAB — LIPID PANEL
Cholesterol: 204 mg/dL — ABNORMAL HIGH (ref 0–200)
HDL: 64 mg/dL (ref >39.00)
LDL Cholesterol: 117 mg/dL — ABNORMAL HIGH (ref 0–99)
NonHDL: 140
TRIGLYCERIDES: 113 mg/dL (ref 0.0–149.0)
Total CHOL/HDL Ratio: 3
VLDL: 22.6 mg/dL (ref 0.0–40.0)

## 2013-09-05 LAB — POCT URINALYSIS DIPSTICK
Bilirubin, UA: NEGATIVE
Glucose, UA: NEGATIVE
Ketones, UA: NEGATIVE
Leukocytes, UA: NEGATIVE
Nitrite, UA: NEGATIVE
Protein, UA: NEGATIVE
RBC UA: NEGATIVE
SPEC GRAV UA: 1.025
Urobilinogen, UA: 0.2
pH, UA: 5.5

## 2013-09-05 LAB — TSH: TSH: 3.23 u[IU]/mL (ref 0.35–4.50)

## 2013-09-05 MED ORDER — ESTRADIOL 1 MG PO TABS: 1.0000 mg | ORAL_TABLET | Freq: Every day | ORAL | Status: DC

## 2013-09-05 NOTE — Progress Notes (Signed)
Patient ID: Susan Lester, female   DOB: Nov 29, 1945, 68 y.o.   MRN: [id number]  Subjective:    Patient ID: Susan Lester, female    DOB: February 26, 1946, 68 y.o.   MRN: [id number]  HPI 80 -year-old patient who is seen today for a preventive health examination.  She is followed by gynecology and GI. She has a history of colonic polyps and menopausal syndrome.  In general she is doing quite well. No concerns or complaints.  She has given herself multiple trials over the years of discontinuing HRT, but continues to have significant vasomotor symptoms.  Wt Readings from Last 3 Encounters:  09/05/13 173 lb (78.472 kg)  09/05/12 187 lb (84.823 kg)  08/13/11 178 lb (80.74 kg)    Allergies:  No Known Drug Allergies   Past History:  Past Medical History:   menopausal syndrome  Colonic polyps, hx of   Past Surgical History:  Hysterectomy  gravida two, para two, aborta zero  colonoscopy 1/09  2012 ([facility])  Family History:   father died age 59, lung cancer, history of COPD  mother died age 46, coronary artery disease, history, hypertension  3 brothers, one accidental death   Social History:   Married      Past Medical History  Diagnosis Date  . COLONIC POLYPS, HX OF 02/01/2007    History   Social History  . Marital Status: Married    Spouse Name: N/A    Number of Children: N/A  . Years of Education: N/A   Occupational History  . Not on file.   Social History Main Topics  . Smoking status: Never Smoker   . Smokeless tobacco: Not on file  . Alcohol Use: Not on file  . Drug Use: Not on file  . Sexual Activity: Not on file   Other Topics Concern  . Not on file   Social History Narrative  . No narrative on file    Past Surgical History  Procedure Laterality Date  . Abdominal hysterectomy      Family History  Problem Relation Age of Onset  . Heart disease Mother   . Hypertension Mother   . Cancer Father     lung ca  . COPD Father     No Known  Allergies  No current outpatient prescriptions on file prior to visit.   No current facility-administered medications on file prior to visit.    BP 140/86  Pulse 61  Temp(Src) 97.9 F (36.6 C) (Oral)  Resp 20  Ht 5' 3.5" (1.613 m)  Wt 173 lb (78.472 kg)  BMI 30.16 kg/m2  SpO2 97%  1. Risk factors, based on past  M,S,F history-  no significant cardiovascular risk factors  2.  Physical activities: Fairly sedentary. No exercise limitations  3.  Depression/mood: No history depression or mood disorder  4.  Hearing: No deficits  5.  ADL's: Independent in all aspects of daily living  6.  Fall risk: Low  7.  Home safety: No problems identified  8.  Height weight, and visual acuity; weight continues to increase;  no change in visual acuity 9.  Counseling: Regular exercise weight loss all encouraged  10. Lab orders based on risk factors: Laboratory profile reviewed and discussed  11. Referral : Not appropriate at this time. Has had a mammogram and bone density study within the past year. Is followed by gynecology annually  12. Care plan:  Low-salt low-calorie diet regular exercise and weight loss all encouraged  13. Cognitive  assessment: Alert and oriented normal affect. No cognitive dysfunction        Review of Systems  Constitutional: Negative for fever, appetite change, fatigue and unexpected weight change.  HENT: Negative for congestion, dental problem, ear pain, hearing loss, mouth sores, nosebleeds, sinus pressure, sore throat, tinnitus, trouble swallowing and voice change.   Eyes: Negative for photophobia, pain, redness and visual disturbance.  Respiratory: Negative for cough, chest tightness and shortness of breath.   Cardiovascular: Negative for chest pain, palpitations and leg swelling.  Gastrointestinal: Negative for nausea, vomiting, abdominal pain, diarrhea, constipation, blood in stool, abdominal distention and rectal pain.  Genitourinary: Negative for  dysuria, urgency, frequency, hematuria, flank pain, vaginal bleeding, vaginal discharge, difficulty urinating, genital sores, vaginal pain, menstrual problem and pelvic pain.  Musculoskeletal: Negative for arthralgias (Foot pain), back pain and neck stiffness.  Skin: Negative for rash.  Neurological: Negative for dizziness, syncope, speech difficulty, weakness, light-headedness, numbness and headaches.  Hematological: Negative for adenopathy. Does not bruise/bleed easily.  Psychiatric/Behavioral: Negative for suicidal ideas, behavioral problems, self-injury, dysphoric mood and agitation. The patient is not nervous/anxious.        Objective:   Physical Exam  Constitutional: She is oriented to person, place, and time. She appears well-developed and well-nourished.  Overweight Blood pressure 130/80  HENT:  Head: Normocephalic and atraumatic.  Right Ear: External ear normal.  Left Ear: External ear normal.  Mouth/Throat: Oropharynx is clear and moist.  Eyes: Conjunctivae and EOM are normal.  Neck: Normal range of motion. Neck supple. No JVD present. No thyromegaly present.  Cardiovascular: Normal rate, regular rhythm, normal heart sounds and intact distal pulses.   No murmur heard. Pulmonary/Chest: Effort normal and breath sounds normal. She has no wheezes. She has no rales.  Abdominal: Soft. Bowel sounds are normal. She exhibits no distension and no mass. There is no tenderness. There is no rebound and no guarding.  Musculoskeletal: Normal range of motion. She exhibits no edema and no tenderness.  Neurological: She is alert and oriented to person, place, and time. She has normal reflexes. No cranial nerve deficit. She exhibits normal muscle tone. Coordination normal.  Skin: Skin is warm and dry. No rash noted.  Psychiatric: She has a normal mood and affect. Her behavior is normal.          Assessment & Plan:   Preventive health examination    Preventive health examination   Exogenous obesity  Menopausal syndrome   Exercise weight loss encouraged; dietary referral offered Recheck one year

## 2013-09-05 NOTE — Patient Instructions (Signed)
Limit your sodium (Salt) intake    It is important that you exercise regularly, at least 20 minutes 3 to 4 times per week.  If you develop chest pain or shortness of breath seek  medical attention.  You need to lose weight.  Consider a lower calorie diet and regular exercise.  Take a calcium supplement, plus [phone] units of vitamin D

## 2013-09-05 NOTE — Progress Notes (Signed)
Pre-visit discussion using our clinic review tool. No additional management support is needed unless otherwise documented below in the visit note.  

## 2014-01-23 ENCOUNTER — Ambulatory Visit: Payer: Medicare Other

## 2014-01-23 ENCOUNTER — Ambulatory Visit (INDEPENDENT_AMBULATORY_CARE_PROVIDER_SITE_OTHER): Payer: Medicare Other

## 2014-01-23 DIAGNOSIS — Z23 Encounter for immunization: Secondary | ICD-10-CM

## 2014-02-14 ENCOUNTER — Telehealth: Payer: Self-pay | Admitting: Internal Medicine

## 2014-02-14 DIAGNOSIS — Z13820 Encounter for screening for osteoporosis: Secondary | ICD-10-CM

## 2014-02-14 NOTE — Telephone Encounter (Signed)
Called and spoke with pt and pt is aware bone density has been ordered and hemoccult cards will be placed at the front desk for pick up.

## 2014-02-14 NOTE — Telephone Encounter (Signed)
Pt had [employer] on conference call, and they states pt needs hemoccult done before the year end, mammogram and bone density. Instructed pt to call and schedule own mammogram. Pt is going to [place], and would like to get bone density done at the same time. Need bone density order, and hemoccult. Pt's lives in [place], [place] and will be here 12/8 . Would like to pu hemocult  then. pls advise

## 2014-03-11 LAB — HM DEXA SCAN

## 2014-03-18 LAB — HM MAMMOGRAPHY

## 2014-03-23 ENCOUNTER — Encounter: Payer: Self-pay | Admitting: Internal Medicine

## 2014-03-27 ENCOUNTER — Other Ambulatory Visit (INDEPENDENT_AMBULATORY_CARE_PROVIDER_SITE_OTHER): Payer: BLUE CROSS/BLUE SHIELD

## 2014-03-27 DIAGNOSIS — Z0000 Encounter for general adult medical examination without abnormal findings: Secondary | ICD-10-CM

## 2014-03-27 LAB — HEMOCCULT GUIAC POC 1CARD (OFFICE)
Card #2 Fecal Occult Blod, POC: NEGATIVE
FECAL OCCULT BLD: NEGATIVE
Fecal Occult Blood, POC: NEGATIVE

## 2014-04-04 ENCOUNTER — Encounter: Payer: Self-pay | Admitting: Internal Medicine

## 2014-09-20 ENCOUNTER — Other Ambulatory Visit: Payer: Self-pay | Admitting: Internal Medicine

## 2014-09-25 LAB — HM MAMMOGRAPHY

## 2014-10-01 ENCOUNTER — Encounter: Payer: Self-pay | Admitting: Internal Medicine

## 2014-12-31 ENCOUNTER — Other Ambulatory Visit: Payer: Self-pay | Admitting: Internal Medicine

## 2015-02-19 ENCOUNTER — Ambulatory Visit (INDEPENDENT_AMBULATORY_CARE_PROVIDER_SITE_OTHER): Payer: Medicare Other | Admitting: Internal Medicine

## 2015-02-19 ENCOUNTER — Encounter: Payer: Self-pay | Admitting: Internal Medicine

## 2015-02-19 VITALS — BP 148/90 | HR 81 | Temp 98.8°F | Resp 20 | Ht 63.5 in | Wt 194.0 lb

## 2015-02-19 DIAGNOSIS — N951 Menopausal and female climacteric states: Secondary | ICD-10-CM | POA: Diagnosis not present

## 2015-02-19 DIAGNOSIS — Z0000 Encounter for general adult medical examination without abnormal findings: Secondary | ICD-10-CM

## 2015-02-19 DIAGNOSIS — R030 Elevated blood-pressure reading, without diagnosis of hypertension: Secondary | ICD-10-CM | POA: Diagnosis not present

## 2015-02-19 DIAGNOSIS — Z86010 Personal history of colonic polyps: Secondary | ICD-10-CM

## 2015-02-19 DIAGNOSIS — Z0001 Encounter for general adult medical examination with abnormal findings: Secondary | ICD-10-CM | POA: Diagnosis not present

## 2015-02-19 DIAGNOSIS — R635 Abnormal weight gain: Secondary | ICD-10-CM

## 2015-02-19 DIAGNOSIS — IMO0001 Reserved for inherently not codable concepts without codable children: Secondary | ICD-10-CM

## 2015-02-19 LAB — CBC WITH DIFFERENTIAL/PLATELET
Basophils Absolute: 0 10*3/uL (ref 0.0–0.1)
Basophils Relative: 0.2 % (ref 0.0–3.0)
Eosinophils Absolute: 0.1 10*3/uL (ref 0.0–0.7)
Eosinophils Relative: 1 % (ref 0.0–5.0)
HEMATOCRIT: 39.4 % (ref 36.0–46.0)
Hemoglobin: 13 g/dL (ref 12.0–15.0)
LYMPHS PCT: 15.8 % (ref 12.0–46.0)
Lymphs Abs: 1.6 10*3/uL (ref 0.7–4.0)
MCHC: 33 g/dL (ref 30.0–36.0)
MCV: 88 fl (ref 78.0–100.0)
Monocytes Absolute: 0.5 10*3/uL (ref 0.1–1.0)
Monocytes Relative: 5.4 % (ref 3.0–12.0)
Neutro Abs: 7.7 10*3/uL (ref 1.4–7.7)
Neutrophils Relative %: 77.6 % — ABNORMAL HIGH (ref 43.0–77.0)
Platelets: 266 10*3/uL (ref 150.0–400.0)
RBC: 4.48 Mil/uL (ref 3.87–5.11)
RDW: 13.7 % (ref 11.5–15.5)
WBC: 9.9 10*3/uL (ref 4.0–10.5)

## 2015-02-19 LAB — COMPREHENSIVE METABOLIC PANEL
ALT: 11 U/L (ref 0–35)
AST: 14 U/L (ref 0–37)
Albumin: 3.7 g/dL (ref 3.5–5.2)
Alkaline Phosphatase: 81 U/L (ref 39–117)
BUN: 17 mg/dL (ref 6–23)
CALCIUM: 9.2 mg/dL (ref 8.4–10.5)
CHLORIDE: 102 meq/L (ref 96–112)
CO2: 29 mEq/L (ref 19–32)
Creatinine, Ser: 0.94 mg/dL (ref 0.40–1.20)
GFR: 62.59 mL/min (ref >60.00)
Glucose, Bld: 103 mg/dL — ABNORMAL HIGH (ref 70–99)
Potassium: 4.4 mEq/L (ref 3.5–5.1)
Sodium: 138 mEq/L (ref 135–145)
Total Bilirubin: 0.5 mg/dL (ref 0.2–1.2)
Total Protein: 7.5 g/dL (ref 6.0–8.3)

## 2015-02-19 LAB — TSH: TSH: 3.25 u[IU]/mL (ref 0.35–4.50)

## 2015-02-19 LAB — LIPID PANEL
CHOL/HDL RATIO: 4
Cholesterol: 212 mg/dL — ABNORMAL HIGH (ref 0–200)
HDL: 60.5 mg/dL (ref >39.00)
LDL CALC: 121 mg/dL — AB (ref 0–99)
NonHDL: 151.49
TRIGLYCERIDES: 151 mg/dL — AB (ref 0.0–149.0)
VLDL: 30.2 mg/dL (ref 0.0–40.0)

## 2015-02-19 NOTE — Patient Instructions (Signed)
Limit your sodium (Salt) intake    It is important that you exercise regularly, at least 20 minutes 3 to 4 times per week.  If you develop chest pain or shortness of breath seek  medical attention.  You need to lose weight.  Consider a lower calorie diet and regular exercise.  Please check your blood pressure on a regular basis.  If it is consistently greater than 150/90, please make an office appointment.  Attempt to taper and discontinue hormone replacement therapy  DASH Eating Plan DASH stands for "Dietary Approaches to Stop Hypertension." The DASH eating plan is a healthy eating plan that has been shown to reduce high blood pressure (hypertension). Additional health benefits may include reducing the risk of type 2 diabetes mellitus, heart disease, and stroke. The DASH eating plan may also help with weight loss. WHAT DO I NEED TO KNOW ABOUT THE DASH EATING PLAN? For the DASH eating plan, you will follow these general guidelines:  Choose foods with a percent daily value for sodium of less than 5% (as listed on the food label).  Use salt-free seasonings or herbs instead of table salt or sea salt.  Check with your health care provider or pharmacist before using salt substitutes.  Eat lower-sodium products, often labeled as "lower sodium" or "no salt added."  Eat fresh foods.  Eat more vegetables, fruits, and low-fat dairy products.  Choose whole grains. Look for the word "whole" as the first word in the ingredient list.  Choose fish and skinless chicken or [place] more often than red meat. Limit fish, poultry, and meat to 6 oz (170 g) each day.  Limit sweets, desserts, sugars, and sugary drinks.  Choose heart-healthy fats.  Limit cheese to 1 oz (28 g) per day.  Eat more home-cooked food and less restaurant, buffet, and fast food.  Limit fried foods.  Cook foods using methods other than frying.  Limit canned vegetables. If you do use them, rinse them well to decrease the  sodium.  When eating at a restaurant, ask that your food be prepared with less salt, or no salt if possible. WHAT FOODS CAN I EAT? Seek help from a dietitian for individual calorie needs. Grains Whole grain or whole wheat bread. Brown rice. Whole grain or whole wheat pasta. Quinoa, bulgur, and whole grain cereals. Low-sodium cereals. Corn or whole wheat flour tortillas. Whole grain cornbread. Whole grain crackers. Low-sodium crackers. Vegetables Fresh or frozen vegetables (raw, steamed, roasted, or grilled). Low-sodium or reduced-sodium tomato and vegetable juices. Low-sodium or reduced-sodium tomato sauce and paste. Low-sodium or reduced-sodium canned vegetables.  Fruits All fresh, canned (in natural juice), or frozen fruits. Meat and Other Protein Products Ground beef (85% or leaner), grass-fed beef, or beef trimmed of fat. Skinless chicken or [place]. Ground chicken or [place]. Pork trimmed of fat. All fish and seafood. Eggs. Dried beans, peas, or lentils. Unsalted nuts and seeds. Unsalted canned beans. Dairy Low-fat dairy products, such as skim or 1% milk, 2% or reduced-fat cheeses, low-fat ricotta or cottage cheese, or plain low-fat yogurt. Low-sodium or reduced-sodium cheeses. Fats and Oils Tub margarines without trans fats. Light or reduced-fat mayonnaise and salad dressings (reduced sodium). Avocado. Safflower, olive, or canola oils. Natural peanut or almond butter. Other Unsalted popcorn and pretzels. The items listed above may not be a complete list of recommended foods or beverages. Contact your dietitian for more options. WHAT FOODS ARE NOT RECOMMENDED? Grains White bread. White pasta. White rice. Refined cornbread. Bagels and croissants. Crackers that  contain trans fat. Vegetables Creamed or fried vegetables. Vegetables in a cheese sauce. Regular canned vegetables. Regular canned tomato sauce and paste. Regular tomato and vegetable juices. Fruits Dried fruits. Canned fruit in  light or heavy syrup. Fruit juice. Meat and Other Protein Products Fatty cuts of meat. Ribs, chicken wings, bacon, sausage, bologna, salami, chitterlings, fatback, hot dogs, bratwurst, and packaged luncheon meats. Salted nuts and seeds. Canned beans with salt. Dairy Whole or 2% milk, cream, half-and-half, and cream cheese. Whole-fat or sweetened yogurt. Full-fat cheeses or blue cheese. Nondairy creamers and whipped toppings. Processed cheese, cheese spreads, or cheese curds. Condiments Onion and garlic salt, seasoned salt, table salt, and sea salt. Canned and packaged gravies. Worcestershire sauce. Tartar sauce. Barbecue sauce. Teriyaki sauce. Soy sauce, including reduced sodium. Steak sauce. Fish sauce. Oyster sauce. Cocktail sauce. Horseradish. Ketchup and mustard. Meat flavorings and tenderizers. Bouillon cubes. Hot sauce. Tabasco sauce. Marinades. Taco seasonings. Relishes. Fats and Oils Butter, stick margarine, lard, shortening, ghee, and bacon fat. Coconut, palm kernel, or palm oils. Regular salad dressings. Other Pickles and olives. Salted popcorn and pretzels. The items listed above may not be a complete list of foods and beverages to avoid. Contact your dietitian for more information. WHERE CAN I FIND MORE INFORMATION? National Heart, Lung, and Blood Institute: travelstabloid.com   This information is not intended to replace advice given to you by your health care provider. Make sure you discuss any questions you have with your health care provider.   Document Released: 02/18/2011 Document Revised: 03/22/2014 Document Reviewed: 01/03/2013 Elsevier Interactive Patient Education [employer]. Menopause is a normal process in which your reproductive ability comes to an end. This process happens gradually over a span of months to years, usually between the ages of 22 and 48. Menopause is complete when you have missed 12 consecutive menstrual periods. It  is important to talk with your health care provider about some of the most common conditions that affect postmenopausal women, such as heart disease, cancer, and bone loss (osteoporosis). Adopting a healthy lifestyle and getting preventive care can help to promote your health and wellness. Those actions can also lower your chances of developing some of these common conditions. WHAT SHOULD I KNOW ABOUT MENOPAUSE? During menopause, you may experience a number of symptoms, such as:  Moderate-to-severe hot flashes.  Night sweats.  Decrease in sex drive.  Mood swings.  Headaches.  Tiredness.  Irritability.  Memory problems.  Insomnia. Choosing to treat or not to treat menopausal changes is an individual decision that you make with your health care provider. WHAT SHOULD I KNOW ABOUT HORMONE REPLACEMENT THERAPY AND SUPPLEMENTS? Hormone therapy products are effective for treating symptoms that are associated with menopause, such as hot flashes and night sweats. Hormone replacement carries certain risks, especially as you become older. If you are thinking about using estrogen or estrogen with progestin treatments, discuss the benefits and risks with your health care provider. WHAT SHOULD I KNOW ABOUT HEART DISEASE AND STROKE? Heart disease, heart attack, and stroke become more likely as you age. This may be due, in part, to the hormonal changes that your body experiences during menopause. These can affect how your body processes dietary fats, triglycerides, and cholesterol. Heart attack and stroke are both medical emergencies. There are many things that you can do to help prevent heart disease and stroke:  Have your blood pressure checked at least every 1-2 years. High blood pressure causes heart disease and increases the risk of stroke.  If  you are 9-74 years old, ask your health care provider if you should take aspirin to prevent a heart attack or a stroke.  Do not use any tobacco products,  including cigarettes, chewing tobacco, or electronic cigarettes. If you need help quitting, ask your health care provider.  It is important to eat a healthy diet and maintain a healthy weight.  Be sure to include plenty of vegetables, fruits, low-fat dairy products, and lean protein.  Avoid eating foods that are high in solid fats, added sugars, or salt (sodium).  Get regular exercise. This is one of the most important things that you can do for your health.  Try to exercise for at least 150 minutes each week. The type of exercise that you do should increase your heart rate and make you sweat. This is known as moderate-intensity exercise.  Try to do strengthening exercises at least twice each week. Do these in addition to the moderate-intensity exercise.  Know your numbers.Ask your health care provider to check your cholesterol and your blood glucose. Continue to have your blood tested as directed by your health care provider. WHAT SHOULD I KNOW ABOUT CANCER SCREENING? There are several types of cancer. Take the following steps to reduce your risk and to catch any cancer development as early as possible. Breast Cancer  Practice breast self-awareness.  This means understanding how your breasts normally appear and feel.  It also means doing regular breast self-exams. Let your health care provider know about any changes, no matter how small.  If you are 89 or older, have a clinician do a breast exam (clinical breast exam or CBE) every year. Depending on your age, family history, and medical history, it may be recommended that you also have a yearly breast X-ray (mammogram).  If you have a family history of breast cancer, talk with your health care provider about genetic screening.  If you are at high risk for breast cancer, talk with your health care provider about having an MRI and a mammogram every year.  Breast cancer (BRCA) gene test is recommended for women who have family members  with BRCA-related cancers. Results of the assessment will determine the need for genetic counseling and BRCA1 and for BRCA2 testing. BRCA-related cancers include these types:  Breast. This occurs in males or females.  Ovarian.  Tubal. This may also be called fallopian tube cancer.  Cancer of the abdominal or pelvic lining (peritoneal cancer).  Prostate.  Pancreatic. Cervical, Uterine, and Ovarian Cancer Your health care provider may recommend that you be screened regularly for cancer of the pelvic organs. These include your ovaries, uterus, and vagina. This screening involves a pelvic exam, which includes checking for microscopic changes to the surface of your cervix (Pap test).  For women ages 21-65, health care providers may recommend a pelvic exam and a Pap test every three years. For women ages 55-65, they may recommend the Pap test and pelvic exam, combined with testing for human papilloma virus (HPV), every five years. Some types of HPV increase your risk of cervical cancer. Testing for HPV may also be done on women of any age who have unclear Pap test results.  Other health care providers may not recommend any screening for nonpregnant women who are considered low risk for pelvic cancer and have no symptoms. Ask your health care provider if a screening pelvic exam is right for you.  If you have had past treatment for cervical cancer or a condition that could lead to cancer,  you need Pap tests and screening for cancer for at least 20 years after your treatment. If Pap tests have been discontinued for you, your risk factors (such as having a new sexual partner) need to be reassessed to determine if you should start having screenings again. Some women have medical problems that increase the chance of getting cervical cancer. In these cases, your health care provider may recommend that you have screening and Pap tests more often.  If you have a family history of uterine cancer or ovarian  cancer, talk with your health care provider about genetic screening.  If you have vaginal bleeding after reaching menopause, tell your health care provider.  There are currently no reliable tests available to screen for ovarian cancer. Lung Cancer Lung cancer screening is recommended for adults 57-89 years old who are at high risk for lung cancer because of a history of smoking. A yearly low-dose CT scan of the lungs is recommended if you:  Currently smoke.  Have a history of at least 30 pack-years of smoking and you currently smoke or have quit within the past 15 years. A pack-year is smoking an average of one pack of cigarettes per day for one year. Yearly screening should:  Continue until it has been 15 years since you quit.  Stop if you develop a health problem that would prevent you from having lung cancer treatment. Colorectal Cancer  This type of cancer can be detected and can often be prevented.  Routine colorectal cancer screening usually begins at age 21 and continues through age 24.  If you have risk factors for colon cancer, your health care provider may recommend that you be screened at an earlier age.  If you have a family history of colorectal cancer, talk with your health care provider about genetic screening.  Your health care provider may also recommend using home test kits to check for hidden blood in your stool.  A small camera at the end of a tube can be used to examine your colon directly (sigmoidoscopy or colonoscopy). This is done to check for the earliest forms of colorectal cancer.  Direct examination of the colon should be repeated every 5-10 years until age 50. However, if early forms of precancerous polyps or small growths are found or if you have a family history or genetic risk for colorectal cancer, you may need to be screened more often. Skin Cancer  Check your skin from head to toe regularly.  Monitor any moles. Be sure to tell your health care  provider:  About any new moles or changes in moles, especially if there is a change in a mole's shape or color.  If you have a mole that is larger than the size of a pencil eraser.  If any of your family members has a history of skin cancer, especially at a young age, talk with your health care provider about genetic screening.  Always use sunscreen. Apply sunscreen liberally and repeatedly throughout the day.  Whenever you are outside, protect yourself by wearing long sleeves, pants, a wide-brimmed hat, and sunglasses. WHAT SHOULD I KNOW ABOUT OSTEOPOROSIS? Osteoporosis is a condition in which bone destruction happens more quickly than new bone creation. After menopause, you may be at an increased risk for osteoporosis. To help prevent osteoporosis or the bone fractures that can happen because of osteoporosis, the following is recommended:  If you are 53-41 years old, get at least 1,000 mg of calcium and at least 600 mg of vitamin  D per day.  If you are older than age 50 but younger than age 42, get at least 1,200 mg of calcium and at least 600 mg of vitamin D per day.  If you are older than age 30, get at least 1,200 mg of calcium and at least 800 mg of vitamin D per day. Smoking and excessive alcohol intake increase the risk of osteoporosis. Eat foods that are rich in calcium and vitamin D, and do weight-bearing exercises several times each week as directed by your health care provider. WHAT SHOULD I KNOW ABOUT HOW MENOPAUSE AFFECTS [place]? Depression may occur at any age, but it is more common as you become older. Common symptoms of depression include:  Low or sad mood.  Changes in sleep patterns.  Changes in appetite or eating patterns.  Feeling an overall lack of motivation or enjoyment of activities that you previously enjoyed.  Frequent crying spells. Talk with your health care provider if you think that you are experiencing depression. WHAT SHOULD I KNOW ABOUT  IMMUNIZATIONS? It is important that you get and maintain your immunizations. These include:  Tetanus, diphtheria, and pertussis (Tdap) booster vaccine.  Influenza every year before the flu season begins.  Pneumonia vaccine.  Shingles vaccine. Your health care provider may also recommend other immunizations.   This information is not intended to replace advice given to you by your health care provider. Make sure you discuss any questions you have with your health care provider.   Document Released: 04/23/2005 Document Revised: 03/22/2014 Document Reviewed: 11/01/2013 Elsevier Interactive Patient Education [employer].

## 2015-02-19 NOTE — Progress Notes (Signed)
Subjective:    Patient ID: Susan Lester, female    DOB: 08/22/1945, 69 y.o.   MRN: [id number]  HPI  Patient ID: Susan Lester, female   DOB: May 25, 1945, 69 y.o.   MRN: [id number]  Subjective:    Patient ID: Susan Lester, female    DOB: [birth year], 69 y.o.   MRN: [id number]  HPI 69  -year-old patient who is seen today for a preventive health examination.  She is followed by gynecology and GI. She has a history of colonic polyps and menopausal syndrome.  In general she is doing quite well. No concerns or complaints.  She has given herself multiple trials over the years of discontinuing HRT, but continues to have significant vasomotor symptoms off hormone therapy.  Wt Readings from Last 3 Encounters:  02/19/15 194 lb (87.998 kg)  09/05/13 173 lb (78.472 kg)  09/05/12 187 lb (84.823 kg)    Allergies:  No Known Drug Allergies   Past History:  Past Medical History:   menopausal syndrome  Colonic polyps, hx of   Past Surgical History:  Hysterectomy  gravida two, para two, aborta zero  colonoscopy 1/09  2012 ([facility])  Family History:   father died age 63, lung cancer, history of COPD  mother died age 74, coronary artery disease, history, hypertension  3 brothers, one accidental death  Ca ?type   Social History:   Married      Past Medical History  Diagnosis Date  . COLONIC POLYPS, HX OF 02/01/2007    Social History   Social History  . Marital Status: Married    Spouse Name: N/A  . Number of Children: N/A  . Years of Education: N/A   Occupational History  . Not on file.   Social History Main Topics  . Smoking status: Never Smoker   . Smokeless tobacco: Not on file  . Alcohol Use: Not on file  . Drug Use: Not on file  . Sexual Activity: Not on file   Other Topics Concern  . Not on file   Social History Narrative    Past Surgical History  Procedure Laterality Date  . Abdominal hysterectomy      Family History  Problem Relation Age of  Onset  . Heart disease Mother   . Hypertension Mother   . Cancer Father     lung ca  . COPD Father     No Known Allergies  Current Outpatient Prescriptions on File Prior to Visit  Medication Sig Dispense Refill  . estradiol (ESTRACE) 1 MG tablet TAKE 1 TABLET BY MOUTH EVERY DAY 90 tablet 0  . ibuprofen (ADVIL,MOTRIN) 200 MG tablet Take 200-400 mg by mouth as needed.     No current facility-administered medications on file prior to visit.    BP 148/90 mmHg  Pulse 81  Temp(Src) 98.8 F (37.1 C) (Oral)  Resp 20  Ht 5' 3.5" (1.613 m)  Wt 194 lb (87.998 kg)  BMI 33.82 kg/m2  SpO2 95%  1. Risk factors, based on past  M,S,F history-  no significant cardiovascular risk factors  2.  Physical activities: Fairly sedentary. No exercise limitations  3.  Depression/mood: No history depression or mood disorder  4.  Hearing: No deficits  5.  ADL's: Independent in all aspects of daily living  6.  Fall risk: Low  7.  Home safety: No problems identified  8.  Height weight, and visual acuity; weight continues to increase;  no change in visual acuity 9.  Counseling: Regular exercise weight loss all encouraged  10. Lab orders based on risk factors: Laboratory profile reviewed and discussed  11. Referral : Not appropriate at this time. Has had a mammogram and bone density study within the past year. Is followed by gynecology annually  12. Care plan:  Low-salt low-calorie diet regular exercise and weight loss all encouraged.  Follow-up colonoscopy next year  28. Cognitive assessment: Alert and oriented normal affect. No cognitive dysfunction  14. Preventive health will include annual clinical exams with screening lab.  She will continue have annual mammograms as well as GYN visits.  Follow-up colonoscopy in one year  13.  Provider list includes GI gynecology primary care        Review of Systems  Constitutional: Negative for fever, appetite change, fatigue and unexpected  weight change.  HENT: Negative for congestion, dental problem, ear pain, hearing loss, mouth sores, nosebleeds, sinus pressure, sore throat, tinnitus, trouble swallowing and voice change.   Eyes: Negative for photophobia, pain, redness and visual disturbance.  Respiratory: Negative for cough, chest tightness and shortness of breath.   Cardiovascular: Negative for chest pain, palpitations and leg swelling.  Gastrointestinal: Negative for nausea, vomiting, abdominal pain, diarrhea, constipation, blood in stool, abdominal distention and rectal pain.  Genitourinary: Negative for dysuria, urgency, frequency, hematuria, flank pain, vaginal bleeding, vaginal discharge, difficulty urinating, genital sores, vaginal pain, menstrual problem and pelvic pain.  Musculoskeletal: Negative for arthralgias (Foot pain), back pain and neck stiffness.  Skin: Negative for rash.  Neurological: Negative for dizziness, syncope, speech difficulty, weakness, light-headedness, numbness and headaches.  Hematological: Negative for adenopathy. Does not bruise/bleed easily.  Psychiatric/Behavioral: Negative for suicidal ideas, behavioral problems, self-injury, dysphoric mood and agitation. The patient is not nervous/anxious.        Objective:   Physical Exam  Constitutional: She is oriented to person, place, and time. She appears well-developed and well-nourished.  Overweight Blood pressure 150/90  HENT:  Head: Normocephalic and atraumatic.  Right Ear: External ear normal.  Left Ear: External ear normal.  Mouth/Throat: Oropharynx is clear and moist.  Eyes: Conjunctivae and EOM are normal.  Neck: Normal range of motion. Neck supple. No JVD present. No thyromegaly present.  Cardiovascular: Normal rate, regular rhythm, normal heart sounds and intact distal pulses.   No murmur heard. Pulmonary/Chest: Effort normal and breath sounds normal. She has no wheezes. She has no rales.  Abdominal: Soft. Bowel sounds are normal.  She exhibits no distension and no mass. There is no tenderness. There is no rebound and no guarding.  Musculoskeletal: Normal range of motion. She exhibits no edema and no tenderness.  Neurological: She is alert and oriented to person, place, and time. She has normal reflexes. No cranial nerve deficit. She exhibits normal muscle tone. Coordination normal.  Skin: Skin is warm and dry. No rash noted.  Psychiatric: She has a normal mood and affect. Her behavior is normal.          Assessment & Plan:   Preventive health examination    Preventive health examination  Exogenous obesity  Menopausal syndrome Rule out sustained hypertension.  Lifestyle issues addressed.  Will place on a DASH diet.  Home blood pressure monitoring.  Encouraged   Exercise weight loss encouraged; dietary referral offered Recheck one year  Review of Systems As above    Objective:   Physical Exam  As above      Assessment & Plan:  Preventive health.  Colonoscopy one year. Rule out hypertension.  Will place  on a Dash diet.  Low-salt diet regular exercise and home blood pressure monitoring.  Encouraged Menopausal syndrome.  Patient will try a slow taper of hormone replacement therapy

## 2015-02-19 NOTE — Progress Notes (Signed)
Pre visit review using our clinic review tool, if applicable. No additional management support is needed unless otherwise documented below in the visit note. 

## 2015-04-01 LAB — HM MAMMOGRAPHY

## 2015-04-03 ENCOUNTER — Encounter: Payer: Self-pay | Admitting: Internal Medicine

## 2015-04-04 ENCOUNTER — Telehealth: Payer: Self-pay | Admitting: Internal Medicine

## 2015-04-04 LAB — HM MAMMOGRAPHY

## 2015-04-04 NOTE — Telephone Encounter (Signed)
Left message for Susan Lester to call office.

## 2015-04-04 NOTE — Telephone Encounter (Signed)
Received fax from [facility] order signed and faxed back.

## 2015-04-04 NOTE — Telephone Encounter (Signed)
Janett Billow from [facility] called regarding a order for this patient. She can be reached directly at [phone]

## 2015-04-09 ENCOUNTER — Encounter: Payer: Self-pay | Admitting: Internal Medicine

## 2015-04-18 ENCOUNTER — Encounter: Payer: Self-pay | Admitting: Internal Medicine

## 2015-10-02 ENCOUNTER — Encounter: Payer: Self-pay | Admitting: Internal Medicine

## 2016-03-01 NOTE — Progress Notes (Deleted)
Pre visit review using our clinic review tool, if applicable. No additional management support is needed unless otherwise documented below in the visit note. 

## 2016-03-02 ENCOUNTER — Telehealth: Payer: Self-pay

## 2016-03-02 ENCOUNTER — Ambulatory Visit: Payer: Medicare Other

## 2016-03-02 NOTE — Telephone Encounter (Signed)
Patient schedule for AWV at [facility] today at 10;30 Call and LVM that she needs to call the [facility] office and schedule with Dr. Raliegh Ip or myself for AWV.   She is with [employer] medicare advantage and should pay for both CPE and AWV

## 2016-03-03 ENCOUNTER — Ambulatory Visit (INDEPENDENT_AMBULATORY_CARE_PROVIDER_SITE_OTHER): Payer: Medicare Other

## 2016-03-03 VITALS — BP 148/90 | HR 74 | Ht 63.0 in | Wt 182.0 lb

## 2016-03-03 DIAGNOSIS — Z0000 Encounter for general adult medical examination without abnormal findings: Secondary | ICD-10-CM | POA: Diagnosis not present

## 2016-03-03 NOTE — Patient Instructions (Addendum)
Susan Lester , Thank you for taking time to come for your Medicare Wellness Visit. I appreciate your ongoing commitment to your health goals. Please review the following plan we discussed and let me know if I can assist you in the future.   Will get eyes checked per usual  Will make apt with Dr. Raliegh Ip upon leaving to discuss shoulder pain; OA of knee ?   Osteopenia; will try to get 6222 of calicum with food   Will request Dr. Raliegh Ip check vit d  Will schedule her colonoscopy in the coming year  Will also address hot flashes with Dr. Raliegh Ip / or Dr. Ulanda Edison   Dr. Raliegh Ip to evaluate possible sleep apnea   Gives blood routinely; last time July 2017 and will take Hep c out of epic since routinely checked with donations   These are the goals we discussed: Goals    . Weight (lb) < 160 lb (72.6 kg)          Check out  online nutrition programs as Gum[employer].nl and http://vang.com/; fit37m; Look for foods with "whole" wheat; bran; oatmeal etc Shot at the farmer's markets in season for fresher choices  Watch for "hydrogenated" on the label of oils which are trans-fats.  Watch for "high fructose corn syrup" in snacks, yogurt or ketchup  Meats have less marbling; bright colored fruits and vegetables;  Canned; dump out liquid and wash vegetables. Be mindful of what we are eating  Portion control is essential to a health weight! Sit down; take a break and enjoy your meal; take smaller bites; put the fork down between bites;  It takes 20 minutes to get full; so check in with your fullness cues and stop eating when you start to fill full              This is a list of the screening recommended for you and due dates:  Health Maintenance  Topic Date Due  .  Hepatitis C: One time screening is recommended by Center for Disease Control  (CDC) for  adults born from [birth year]through 1965.   [id number] . Flu Shot  10/14/2015  . Mammogram  04/03/2016  . Colon Cancer Screening  06/24/2020  . Tetanus Vaccine   09/06/2022  . DEXA scan (bone density measurement)  Completed  . Shingles Vaccine  Completed  . Pneumonia vaccines  Completed        Fall Prevention in the Home Introduction Falls can cause injuries. They can happen to people of all ages. There are many things you can do to make your home safe and to help prevent falls. What can I do on the outside of my home?  Regularly fix the edges of walkways and driveways and fix any cracks.  Remove anything that might make you trip as you walk through a door, such as a raised step or threshold.  Trim any bushes or trees on the path to your home.  Use bright outdoor lighting.  Clear any walking paths of anything that might make someone trip, such as rocks or tools.  Regularly check to see if handrails are loose or broken. Make sure that both sides of any steps have handrails.  Any raised decks and porches should have guardrails on the edges.  Have any leaves, snow, or ice cleared regularly.  Use sand or salt on walking paths during winter.  Clean up any spills in your garage right away. This includes oil or grease spills. What can I do  in the bathroom?  Use night lights.  Install grab bars by the toilet and in the tub and shower. Do not use towel bars as grab bars.  Use non-skid mats or decals in the tub or shower.  If you need to sit down in the shower, use a plastic, non-slip stool.  Keep the floor dry. Clean up any water that spills on the floor as soon as it happens.  Remove soap buildup in the tub or shower regularly.  Attach bath mats securely with double-sided non-slip rug tape.  Do not have throw rugs and other things on the floor that can make you trip. What can I do in the bedroom?  Use night lights.  Make sure that you have a light by your bed that is easy to reach.  Do not use any sheets or blankets that are too big for your bed. They should not hang down onto the floor.  Have a firm chair that has side  arms. You can use this for support while you get dressed.  Do not have throw rugs and other things on the floor that can make you trip. What can I do in the kitchen?  Clean up any spills right away.  Avoid walking on wet floors.  Keep items that you use a lot in easy-to-reach places.  If you need to reach something above you, use a strong step stool that has a grab bar.  Keep electrical cords out of the way.  Do not use floor polish or wax that makes floors slippery. If you must use wax, use non-skid floor wax.  Do not have throw rugs and other things on the floor that can make you trip. What can I do with my stairs?  Do not leave any items on the stairs.  Make sure that there are handrails on both sides of the stairs and use them. Fix handrails that are broken or loose. Make sure that handrails are as long as the stairways.  Check any carpeting to make sure that it is firmly attached to the stairs. Fix any carpet that is loose or worn.  Avoid having throw rugs at the top or bottom of the stairs. If you do have throw rugs, attach them to the floor with carpet tape.  Make sure that you have a light switch at the top of the stairs and the bottom of the stairs. If you do not have them, ask someone to add them for you. What else can I do to help prevent falls?  Wear shoes that:  Do not have high heels.  Have rubber bottoms.  Are comfortable and fit you well.  Are closed at the toe. Do not wear sandals.  If you use a stepladder:  Make sure that it is fully opened. Do not climb a closed stepladder.  Make sure that both sides of the stepladder are locked into place.  Ask someone to hold it for you, if possible.  Clearly mark and make sure that you can see:  Any grab bars or handrails.  First and last steps.  Where the edge of each step is.  Use tools that help you move around (mobility aids) if they are needed. These  include:  Canes.  Walkers.  Scooters.  Crutches.  Turn on the lights when you go into a dark area. Replace any light bulbs as soon as they burn out.  Set up your furniture so you have a clear path. Avoid moving your furniture  around.  If any of your floors are uneven, fix them.  If there are any pets around you, be aware of where they are.  Review your medicines with your doctor. Some medicines can make you feel dizzy. This can increase your chance of falling. Ask your doctor what other things that you can do to help prevent falls. This information is not intended to replace advice given to you by your health care provider. Make sure you discuss any questions you have with your health care provider. Document Released: 12/26/2008 Document Revised: 08/07/2015 Document Reviewed: 04/05/2014  2017 [employer]  Health Maintenance, Female Introduction Adopting a healthy lifestyle and getting preventive care can go a long way to promote health and wellness. Talk with your health care provider about what schedule of regular examinations is right for you. This is a good chance for you to check in with your provider about disease prevention and staying healthy. In between checkups, there are plenty of things you can do on your own. Experts have done a lot of research about which lifestyle changes and preventive measures are most likely to keep you healthy. Ask your health care provider for more information. Weight and diet Eat a healthy diet  Be sure to include plenty of vegetables, fruits, low-fat dairy products, and lean protein.  Do not eat a lot of foods high in solid fats, added sugars, or salt.  Get regular exercise. This is one of the most important things you can do for your health.  Most adults should exercise for at least 150 minutes each week. The exercise should increase your heart rate and make you sweat (moderate-intensity exercise).  Most adults should also do strengthening  exercises at least twice a week. This is in addition to the moderate-intensity exercise. Maintain a healthy weight  Body mass index (BMI) is a measurement that can be used to identify possible weight problems. It estimates body fat based on height and weight. Your health care provider can help determine your BMI and help you achieve or maintain a healthy weight.  For females 37 years of age and older:  A BMI below 18.5 is considered underweight.  A BMI of 18.5 to 24.9 is normal.  A BMI of 25 to 29.9 is considered overweight.  A BMI of 30 and above is considered obese. Watch levels of cholesterol and blood lipids  You should start having your blood tested for lipids and cholesterol at 70 years of age, then have this test every 5 years.  You may need to have your cholesterol levels checked more often if:  Your lipid or cholesterol levels are high.  You are older than 70 years of age.  You are at high risk for heart disease. Cancer screening Lung Cancer  Lung cancer screening is recommended for adults 63-9 years old who are at high risk for lung cancer because of a history of smoking.  A yearly low-dose CT scan of the lungs is recommended for people who:  Currently smoke.  Have quit within the past 15 years.  Have at least a 30-pack-year history of smoking. A pack year is smoking an average of one pack of cigarettes a day for 1 year.  Yearly screening should continue until it has been 15 years since you quit.  Yearly screening should stop if you develop a health problem that would prevent you from having lung cancer treatment. Breast Cancer  Practice breast self-awareness. This means understanding how your breasts normally appear and feel.  It also means doing regular breast self-exams. Let your health care provider know about any changes, no matter how small.  If you are in your 20s or 30s, you should have a clinical breast exam (CBE) by a health care provider every 1-3  years as part of a regular health exam.  If you are 9 or older, have a CBE every year. Also consider having a breast X-ray (mammogram) every year.  If you have a family history of breast cancer, talk to your health care provider about genetic screening.  If you are at high risk for breast cancer, talk to your health care provider about having an MRI and a mammogram every year.  Breast cancer gene (BRCA) assessment is recommended for women who have family members with BRCA-related cancers. BRCA-related cancers include:  Breast.  Ovarian.  Tubal.  Peritoneal cancers.  Results of the assessment will determine the need for genetic counseling and BRCA1 and BRCA2 testing. Cervical Cancer  Your health care provider may recommend that you be screened regularly for cancer of the pelvic organs (ovaries, uterus, and vagina). This screening involves a pelvic examination, including checking for microscopic changes to the surface of your cervix (Pap test). You may be encouraged to have this screening done every 3 years, beginning at age 14.  For women ages 36-65, health care providers may recommend pelvic exams and Pap testing every 3 years, or they may recommend the Pap and pelvic exam, combined with testing for human papilloma virus (HPV), every 5 years. Some types of HPV increase your risk of cervical cancer. Testing for HPV may also be done on women of any age with unclear Pap test results.  Other health care providers may not recommend any screening for nonpregnant women who are considered low risk for pelvic cancer and who do not have symptoms. Ask your health care provider if a screening pelvic exam is right for you.  If you have had past treatment for cervical cancer or a condition that could lead to cancer, you need Pap tests and screening for cancer for at least 20 years after your treatment. If Pap tests have been discontinued, your risk factors (such as having a new sexual partner) need to  be reassessed to determine if screening should resume. Some women have medical problems that increase the chance of getting cervical cancer. In these cases, your health care provider may recommend more frequent screening and Pap tests. Colorectal Cancer  This type of cancer can be detected and often prevented.  Routine colorectal cancer screening usually begins at 70 years of age and continues through 70 years of age.  Your health care provider may recommend screening at an earlier age if you have risk factors for colon cancer.  Your health care provider may also recommend using home test kits to check for hidden blood in the stool.  A small camera at the end of a tube can be used to examine your colon directly (sigmoidoscopy or colonoscopy). This is done to check for the earliest forms of colorectal cancer.  Routine screening usually begins at age 86.  Direct examination of the colon should be repeated every 5-10 years through 70 years of age. However, you may need to be screened more often if early forms of precancerous polyps or small growths are found. Skin Cancer  Check your skin from head to toe regularly.  Tell your health care provider about any new moles or changes in moles, especially if there is a change in  a mole's shape or color.  Also tell your health care provider if you have a mole that is larger than the size of a pencil eraser.  Always use sunscreen. Apply sunscreen liberally and repeatedly throughout the day.  Protect yourself by wearing long sleeves, pants, a wide-brimmed hat, and sunglasses whenever you are outside. Heart disease, diabetes, and high blood pressure  High blood pressure causes heart disease and increases the risk of stroke. High blood pressure is more likely to develop in:  People who have blood pressure in the high end of the normal range (130-139/85-89 mm Hg).  People who are overweight or obese.  People who are African American.  If you are  64-30 years of age, have your blood pressure checked every 3-5 years. If you are 45 years of age or older, have your blood pressure checked every year. You should have your blood pressure measured twice-once when you are at a hospital or clinic, and once when you are not at a hospital or clinic. Record the average of the two measurements. To check your blood pressure when you are not at a hospital or clinic, you can use:  An automated blood pressure machine at a pharmacy.  A home blood pressure monitor.  If you are between 70 years and 8 years old, ask your health care provider if you should take aspirin to prevent strokes.  Have regular diabetes screenings. This involves taking a blood sample to check your fasting blood sugar level.  If you are at a normal weight and have a low risk for diabetes, have this test once every three years after 70 years of age.  If you are overweight and have a high risk for diabetes, consider being tested at a younger age or more often. Preventing infection Hepatitis B  If you have a higher risk for hepatitis B, you should be screened for this virus. You are considered at high risk for hepatitis B if:  You were born in a country where hepatitis B is common. Ask your health care provider which countries are considered high risk.  Your parents were born in a high-risk country, and you have not been immunized against hepatitis B (hepatitis B vaccine).  You have HIV or AIDS.  You use needles to inject street drugs.  You live with someone who has hepatitis B.  You have had sex with someone who has hepatitis B.  You get hemodialysis treatment.  You take certain medicines for conditions, including cancer, organ transplantation, and autoimmune conditions. Hepatitis C  Blood testing is recommended for:  Everyone born from [birth year] through 1965.  Anyone with known risk factors for hepatitis C. Sexually transmitted infections (STIs)  You should be screened  for sexually transmitted infections (STIs) including gonorrhea and chlamydia if:  You are sexually active and are younger than 70 years of age.  You are older than 70 years of age and your health care provider tells you that you are at risk for this type of infection.  Your sexual activity has changed since you were last screened and you are at an increased risk for chlamydia or gonorrhea. Ask your health care provider if you are at risk.  If you do not have HIV, but are at risk, it may be recommended that you take a prescription medicine daily to prevent HIV infection. This is called pre-exposure prophylaxis (PrEP). You are considered at risk if:  You are sexually active and do not regularly use condoms or know the  HIV status of your partner(s).  You take drugs by injection.  You are sexually active with a partner who has HIV. Talk with your health care provider about whether you are at high risk of being infected with HIV. If you choose to begin PrEP, you should first be tested for HIV. You should then be tested every 3 months for as long as you are taking PrEP. Pregnancy  If you are premenopausal and you may become pregnant, ask your health care provider about preconception counseling.  If you may become pregnant, take 400 to 800 micrograms (mcg) of folic acid every day.  If you want to prevent pregnancy, talk to your health care provider about birth control (contraception). Osteoporosis and menopause  Osteoporosis is a disease in which the bones lose minerals and strength with aging. This can result in serious bone fractures. Your risk for osteoporosis can be identified using a bone density scan.  If you are 31 years of age or older, or if you are at risk for osteoporosis and fractures, ask your health care provider if you should be screened.  Ask your health care provider whether you should take a calcium or vitamin D supplement to lower your risk for osteoporosis.  Menopause may  have certain physical symptoms and risks.  Hormone replacement therapy may reduce some of these symptoms and risks. Talk to your health care provider about whether hormone replacement therapy is right for you. Follow these instructions at home:  Schedule regular health, dental, and eye exams.  Stay current with your immunizations.  Do not use any tobacco products including cigarettes, chewing tobacco, or electronic cigarettes.  If you are pregnant, do not drink alcohol.  If you are breastfeeding, limit how much and how often you drink alcohol.  Limit alcohol intake to no more than 1 drink per day for nonpregnant women. One drink equals 12 ounces of beer, 5 ounces of wine, or 1 ounces of hard liquor.  Do not use street drugs.  Do not share needles.  Ask your health care provider for help if you need support or information about quitting drugs.  Tell your health care provider if you often feel depressed.  Tell your health care provider if you have ever been abused or do not feel safe at home. This information is not intended to replace advice given to you by your health care provider. Make sure you discuss any questions you have with your health care provider.  Sleep Apnea Sleep apnea is a condition that affects breathing. People with sleep apnea have moments during sleep when their breathing pauses briefly or gets shallow. Sleep apnea can cause these symptoms:  Trouble staying asleep.  Sleepiness or tiredness during the day.  Irritability.  Loud snoring.  Morning headaches.  Trouble concentrating.  Forgetting things.  Less interest in sex.  Being sleepy for no reason.  Mood swings.  Personality changes.  Depression.  Waking up a lot during the night to pee (urinate).  Dry mouth.  Sore throat. Follow these instructions at home:  Make any changes in your routine that your doctor recommends.  Eat a healthy, well-balanced diet.  Take over-the-counter and  prescription medicines only as told by your doctor.  Avoid using alcohol, calming medicines (sedatives), and narcotic medicines.  Take steps to lose weight if you are overweight.  If you were given a machine (device) to use while you sleep, use it only as told by your doctor.  Do not use any tobacco products,  such as cigarettes, chewing tobacco, and e-cigarettes. If you need help quitting, ask your doctor.  Keep all follow-up visits as told by your doctor. This is important. Contact a doctor if:  The machine that you were given to use during sleep is uncomfortable or does not seem to be working.  Your symptoms do not get better.  Your symptoms get worse. Get help right away if:  Your chest hurts.  You have trouble breathing in enough air (shortness of breath).  You have an uncomfortable feeling in your back, arms, or stomach.  You have trouble talking.  One side of your body feels weak.  A part of your face is hanging down (drooping). These symptoms may be an emergency. Do not wait to see if the symptoms will go away. Get medical help right away. Call your local emergency services (911 in the [place] Do not drive yourself to the hospital.  This information is not intended to replace advice given to you by your health care provider. Make sure you discuss any questions you have with your health care provider. Document Released: 12/09/2007 Document Revised: 10/26/2015 Document Reviewed: 12/09/2014 Elsevier Interactive Patient Education  2017 [place] Released: 09/14/2010 Document Revised: 08/07/2015 Document Reviewed: 12/03/2014  2017 Elsevier

## 2016-03-03 NOTE — Progress Notes (Signed)
Subjective:   Susan Lester is a 70 y.o. female who presents for Medicare Annual/Subsequent preventive examination.  The Patient was informed that the wellness visit is to identify future health risk and educate and initiate measures that can reduce risk for increased disease through the lifespan.    NO ROS; Medicare Wellness Visit Last AWV with DR. Raliegh Ip 02/19/2015  Describes health as good, fair or great? Good  Preventive Screening -Counseling & Management  Colonoscopy 06/2010/ called this year to repeat and she "put it off" agrees to schedule the colonoscopy soon Updated overdue screen in epic   Mammogram 03/2015 / couple of spots; going back jan 25th to recheck, feels it is a cyst;   Dexa; 02/2014  (-1.4)  1200 calcium; vit D; out in the sun Will request Dr. Raliegh Ip check Vit 'd  Does not take otc meds or other   Meds;  Estrodial; 1mg  off x 1 year;  To fup with dr. Ulanda Edison  Hot flash in the office and BP was elevated today   Smoking history/ never smoked  Smokeless tobacco  NO Second Hand Smoke status; No Smokers in the home  RISK FACTORS Regular exercise  Loves to snow ski; likes to ride bikes;  In to horses; riding;  Gardening; [place];  [facility]; lives in [place] BMI:  32;  Breakfast; egg; bacon Lunch; likes salads;  Supper tenderloin; gravy and onions Gets in snacks;   Fall risk no falls; but fall from horse due to the horse falling Was not hurt  Mobility of Functional changes this year?  Lifting due to right shoulder issues and will fup with Dr. Raliegh Ip   Safety; age in place; looking at moving to a home with more acreage; wears sunscreen -no; may wear a hot  safe place for firearms;  Motor vehicle accidents; no  Cardiac Risk Factors:  Advanced aged > 16 in men; >65 in women Hyperlipidemia- chol 212; trig 151; Hdl 60; LDL 121 Diabetes; glucose 103 fasting Family History (father lung cancer (smoked a pipe) ; hx of copd; mother cad;  htn Obesity Bmi 32   Hearing; tinnitis; notices if she gets quite 2000hz  in left ear    Eye exam/ wears glasses Has been one year; Dr. Idolina Primer in [place]  Dilated eye exams  Depression Screen PhQ 2: negative  Activities of Daily Living - See functional screen   Cognitive testing; Ad8 score; 0 or less than 2  MMSE deferred or completed if AD8 + 2 issues  Advanced Directives / yes, will bring a copy   Patient Care Team: Marletta Lor, MD as PCP - General   Immunization History  Administered Date(s) Administered  . Influenza, High Dose Seasonal PF 01/23/2014  . Influenza-Unspecified 12/25/2014, 12/14/2015  . Pneumococcal Conjugate-13 09/05/2013  . Pneumococcal Polysaccharide-23 09/05/2012  . Tdap 09/05/2012  . Zoster 12/01/2012   Required Immunizations needed today  Screening test up to date or reviewed for plan of completion Health Maintenance Due  Topic Date Due  . Hepatitis C Screening  [birth year]  . COLONOSCOPY  06/25/2015  . INFLUENZA VACCINE  10/14/2015   Stated she delayed the colonoscopy but was aware it was due in 5 years. Agrees to schedule soon;   [facility] flu [facility] the [facility]   Hep c If you gave blood in the past 15 years, you were most likely checked for Hep C. If you rec'd blood; you may want to consider testing or  if you are high risk for any other reason.  Gives blood frequently; last time 09/2015 Call to blood donations services and they do check for Hep c past or present exposure.   Flu vaccine; taken at the senior center  Cardiac Risk Factors include: advanced age (>15men, >19 women);dyslipidemia     Objective:    Vitals: BP (!) 148/90   Pulse 74   Ht 5\' 3"  (1.6 m)   Wt 182 lb (82.6 kg)   SpO2 98%   BMI 32.24 kg/m   Body mass index is 32.24 kg/m.  Tobacco History  Smoking Status  . Never Smoker  Smokeless Tobacco  . Never Used     Counseling given: Yes   Past Medical History:  Diagnosis Date  . COLONIC POLYPS, HX OF  02/01/2007   Past Surgical History:  Procedure Laterality Date  . ABDOMINAL HYSTERECTOMY     Family History  Problem Relation Age of Onset  . Heart disease Mother   . Hypertension Mother   . Cancer Father     lung ca  . COPD Father    History  Sexual Activity  . Sexual activity: Not on file    Outpatient Encounter Prescriptions as of 03/03/2016  Medication Sig  . estradiol (ESTRACE) 1 MG tablet TAKE 1 TABLET BY MOUTH EVERY DAY (Patient not taking: Reported on 03/03/2016)  . ibuprofen (ADVIL,MOTRIN) 200 MG tablet Take 200-400 mg by mouth as needed.   No facility-administered encounter medications on file as of 03/03/2016.     Activities of Daily Living In your present state of health, do you have any difficulty performing the following activities: 03/03/2016  Hearing? N  Vision? N  Difficulty concentrating or making decisions? N  Walking or climbing stairs? N  Dressing or bathing? N  Doing errands, shopping? N  Preparing Food and eating ? N  Using the Toilet? N  In the past six months, have you accidently leaked urine? Y  Do you have problems with loss of bowel control? N  Managing your Medications? N  Managing your Finances? N  Housekeeping or managing your Housekeeping? N  Some recent data might be hidden    Patient Care Team: Marletta Lor, MD as PCP - General   Assessment:     Exercise Activities and Dietary recommendations Current Exercise Habits: Home exercise routine, Type of exercise: strength training/weights;walking, Time (Minutes): 60, Frequency (Times/Week): 5, Weekly Exercise (Minutes/Week): 300, Intensity: Moderate  Goals    . Weight (lb) < 160 lb (72.6 kg)          Check out  online nutrition programs as GumSearch.nl and http://vang.com/; fit72me; Look for foods with "whole" wheat; bran; oatmeal etc Shot at the farmer's markets in season for fresher choices  Watch for "hydrogenated" on the label of oils which are trans-fats.  Watch  for "high fructose corn syrup" in snacks, yogurt or ketchup  Meats have less marbling; bright colored fruits and vegetables;  Canned; dump out liquid and wash vegetables. Be mindful of what we are eating  Portion control is essential to a health weight! Sit down; take a break and enjoy your meal; take smaller bites; put the fork down between bites;  It takes 20 minutes to get full; so check in with your fullness cues and stop eating when you start to fill full             Fall Risk Fall Risk  03/03/2016 02/19/2015 09/05/2013  Falls in the past year? No  Yes No  Number falls in past yr: - 1 -  Injury with Fall? - No -  Risk for fall due to : - Impaired balance/gait -   Depression Screen PHQ 2/9 Scores 03/03/2016 02/19/2015 09/05/2013  PHQ - 2 Score 0 0 0    Cognitive Function MMSE - Mini Mental State Exam 03/03/2016  Not completed: (No Data)        Immunization History  Administered Date(s) Administered  . Influenza, High Dose Seasonal PF 01/23/2014  . Influenza-Unspecified 12/25/2014, 12/14/2015  . Pneumococcal Conjugate-13 09/05/2013  . Pneumococcal Polysaccharide-23 09/05/2012  . Tdap 09/05/2012  . Zoster 12/01/2012   Screening Tests Health Maintenance  Topic Date Due  . Hepatitis C Screening  [birth year]  . COLONOSCOPY  06/25/2015  . INFLUENZA VACCINE  10/14/2015  . MAMMOGRAM  04/03/2016  . TETANUS/TDAP  09/06/2022  . DEXA SCAN  Completed  . ZOSTAVAX  Completed  . PNA vac Low Risk Adult  Completed      Plan:      PCP Notes  Health Maintenance (due cpe with Dr. Raliegh Ip and will schedule when leaving today)   1. Colonoscopy due in 5 years; she delayed fup but did receive letter; wlil scheduled soon.  2. Osteopenia; would like a Vit D level drawn   3. Will schedule her colonoscopy next year; (due this year)   4. To repeat mammogram due to 2 spots ? Cyst (jan 2018)  Abnormal Screens   Referrals   Patient concerns deferred to Dr. Raliegh Ip and to schedule CPE    1. Shoulder pain; will fup with Dr. Raliegh Ip (? Rotator cuff)  2. Came off her Estradial but continues to have Hot flashes 3. BP elevated today and will fup with Dr. Raliegh Ip as well   4. Spouse states she snores; does not sleep well secondary to hot flashes  5. fup on knee pain  Nurse Concerns; none x what is noted above   Next PCP apt 05/13/2016   During the course of the visit the patient was educated and counseled about the following appropriate screening and preventive services:   Vaccines to include Pneumoccal, Influenza, Hepatitis B, Td, Zostavax, HCV  Electrocardiogram  Cardiovascular Disease  Colorectal cancer screening/to be scheduled  Diabetes screening neg  Mammogram ; scheduled in jan to fup cyst   Glaucoma screening to schedule eye exam  Nutrition counseling   Smoking cessation counseling  Patient Instructions (the written plan) was given to the patient.    Wynetta Fines, RN  03/03/2016

## 2016-04-06 ENCOUNTER — Encounter: Payer: Self-pay | Admitting: Internal Medicine

## 2016-04-06 LAB — HM DEXA SCAN

## 2016-04-06 LAB — HM MAMMOGRAPHY

## 2016-04-22 ENCOUNTER — Encounter: Payer: Self-pay | Admitting: Internal Medicine

## 2016-04-23 ENCOUNTER — Encounter: Payer: Self-pay | Admitting: Internal Medicine

## 2016-05-13 ENCOUNTER — Ambulatory Visit (INDEPENDENT_AMBULATORY_CARE_PROVIDER_SITE_OTHER): Payer: Medicare PPO | Admitting: Internal Medicine

## 2016-05-13 ENCOUNTER — Encounter: Payer: Self-pay | Admitting: Internal Medicine

## 2016-05-13 VITALS — BP 128/72 | HR 60 | Temp 97.6°F | Ht 63.0 in | Wt 182.2 lb

## 2016-05-13 DIAGNOSIS — M25512 Pain in left shoulder: Secondary | ICD-10-CM | POA: Diagnosis not present

## 2016-05-13 DIAGNOSIS — Z136 Encounter for screening for cardiovascular disorders: Secondary | ICD-10-CM

## 2016-05-13 DIAGNOSIS — Z0001 Encounter for general adult medical examination with abnormal findings: Secondary | ICD-10-CM

## 2016-05-13 DIAGNOSIS — Z13228 Encounter for screening for other metabolic disorders: Secondary | ICD-10-CM

## 2016-05-13 DIAGNOSIS — Z86010 Personal history of colonic polyps: Secondary | ICD-10-CM

## 2016-05-13 DIAGNOSIS — N951 Menopausal and female climacteric states: Secondary | ICD-10-CM | POA: Diagnosis not present

## 2016-05-13 DIAGNOSIS — M25511 Pain in right shoulder: Secondary | ICD-10-CM | POA: Diagnosis not present

## 2016-05-13 DIAGNOSIS — Z130 Encounter for screening for diseases of the blood and blood-forming organs and certain disorders involving the immune mechanism: Secondary | ICD-10-CM | POA: Diagnosis not present

## 2016-05-13 DIAGNOSIS — Z0000 Encounter for general adult medical examination without abnormal findings: Secondary | ICD-10-CM

## 2016-05-13 LAB — LIPID PANEL
Cholesterol: 190 mg/dL (ref 0–200)
HDL: 51.5 mg/dL (ref >39.00)
LDL Cholesterol: 108 mg/dL — ABNORMAL HIGH (ref 0–99)
NonHDL: 138.54
Total CHOL/HDL Ratio: 4
Triglycerides: 154 mg/dL — ABNORMAL HIGH (ref 0.0–149.0)
VLDL: 30.8 mg/dL (ref 0.0–40.0)

## 2016-05-13 LAB — COMPREHENSIVE METABOLIC PANEL
ALBUMIN: 3.9 g/dL (ref 3.5–5.2)
ALK PHOS: 83 U/L (ref 39–117)
ALT: 15 U/L (ref 0–35)
AST: 15 U/L (ref 0–37)
BILIRUBIN TOTAL: 0.6 mg/dL (ref 0.2–1.2)
BUN: 16 mg/dL (ref 6–23)
CO2: 28 mEq/L (ref 19–32)
Calcium: 9.1 mg/dL (ref 8.4–10.5)
Chloride: 104 mEq/L (ref 96–112)
Creatinine, Ser: 0.96 mg/dL (ref 0.40–1.20)
GFR: 60.87 mL/min (ref >60.00)
Glucose, Bld: 102 mg/dL — ABNORMAL HIGH (ref 70–99)
POTASSIUM: 4.3 meq/L (ref 3.5–5.1)
Sodium: 138 mEq/L (ref 135–145)
TOTAL PROTEIN: 7.3 g/dL (ref 6.0–8.3)

## 2016-05-13 LAB — CBC WITH DIFFERENTIAL/PLATELET
BASOS ABS: 0 10*3/uL (ref 0.0–0.1)
Basophils Relative: 0.2 % (ref 0.0–3.0)
EOS PCT: 1.4 % (ref 0.0–5.0)
Eosinophils Absolute: 0.1 10*3/uL (ref 0.0–0.7)
HCT: 39.8 % (ref 36.0–46.0)
HEMOGLOBIN: 13 g/dL (ref 12.0–15.0)
Lymphocytes Relative: 36.1 % (ref 12.0–46.0)
Lymphs Abs: 2.2 10*3/uL (ref 0.7–4.0)
MCHC: 32.6 g/dL (ref 30.0–36.0)
MCV: 83.3 fl (ref 78.0–100.0)
MONO ABS: 0.4 10*3/uL (ref 0.1–1.0)
MONOS PCT: 6.4 % (ref 3.0–12.0)
NEUTROS PCT: 55.9 % (ref 43.0–77.0)
Neutro Abs: 3.4 10*3/uL (ref 1.4–7.7)
Platelets: 263 10*3/uL (ref 150.0–400.0)
RBC: 4.77 Mil/uL (ref 3.87–5.11)
RDW: 15.9 % — ABNORMAL HIGH (ref 11.5–15.5)
WBC: 6 10*3/uL (ref 4.0–10.5)

## 2016-05-13 LAB — TSH: TSH: 5.9 u[IU]/mL — AB (ref 0.35–4.50)

## 2016-05-13 MED ORDER — ESTRADIOL 1 MG PO TABS: 1.0000 mg | ORAL_TABLET | Freq: Every day | ORAL | 4 refills | Status: DC

## 2016-05-13 NOTE — Patient Instructions (Addendum)
Limit your sodium (Salt) intake    It is important that you exercise regularly, at least 20 minutes 3 to 4 times per week.  If you develop chest pain or shortness of breath seek  medical attention.  Take a calcium supplement, plus [phone] units of vitamin D  You need to lose weight.  Consider a lower calorie diet and regular exercise.  Schedule your colonoscopy to help detect colon cancer.   Shoulder Impingement Syndrome Rehab Ask your health care provider which exercises are safe for you. Do exercises exactly as told by your health care provider and adjust them as directed. It is normal to feel mild stretching, pulling, tightness, or discomfort as you do these exercises, but you should stop right away if you feel sudden pain or your pain gets worse.Do not begin these exercises until told by your health care provider. Stretching and range of motion exercise This exercise warms up your muscles and joints and improves the movement and flexibility of your shoulder. This exercise also helps to relieve pain and stiffness. Exercise A: Passive horizontal adduction   1. Sit or stand and pull your left / right elbow across your chest, toward your other shoulder. Stop when you feel a gentle stretch in the back of your shoulder and upper arm.  Keep your arm at shoulder height.  Keep your arm as close to your body as you comfortably can. 2. Hold for __________ seconds. 3. Slowly return to the starting position. Repeat __________ times. Complete this exercise __________ times a day. Strengthening exercises These exercises build strength and endurance in your shoulder. Endurance is the ability to use your muscles for a long time, even after they get tired. Exercise B: External rotation, isometric  1. Stand or sit in a doorway, facing the door frame. 2. Bend your left / right elbow and place the back of your wrist against the door frame. Only your wrist should be touching the frame. Keep your upper  arm at your side. 3. Gently press your wrist against the door frame, as if you are trying to push your arm away from your abdomen.  Avoid shrugging your shoulder while you press your hand against the door frame. Keep your shoulder blade tucked down toward the middle of your back. 4. Hold for __________ seconds. 5. Slowly release the tension, and relax your muscles completely before you do the exercise again. Repeat __________ times. Complete this exercise __________ times a day. Exercise C: Internal rotation, isometric   1. Stand or sit in a doorway, facing the door frame. 2. Bend your left / right elbow and place the inside of your wrist against the door frame. Only your wrist should be touching the frame. Keep your upper arm at your side. 3. Gently press your wrist against the door frame, as if you are trying to push your arm toward your abdomen.  Avoid shrugging your shoulder while you press your hand against the door frame. Keep your shoulder blade tucked down toward the middle of your back. 4. Hold for __________ seconds. 5. Slowly release the tension, and relax your muscles completely before you do the exercise again. Repeat __________ times. Complete this exercise __________ times a day. Exercise D: Scapular protraction, supine   1. Lie on your back on a firm surface. Hold a __________ weight in your left / right hand. 2. Raise your left / right arm straight into the air so your hand is directly above your shoulder joint. 3. Push the  weight into the air so your shoulder lifts off of the surface that you are lying on. Do not move your head, neck, or back. 4. Hold for __________ seconds. 5. Slowly return to the starting position. Let your muscles relax completely before you repeat this exercise. Repeat __________ times. Complete this exercise __________ times a day. Exercise E: Scapular retraction   1. Sit in a stable chair without armrests, or stand. 2. Secure an exercise band to a  stable object in front of you so the band is at shoulder height. 3. Hold one end of the exercise band in each hand. Your palms should face down. 4. Squeeze your shoulder blades together and move your elbows slightly behind you. Do not shrug your shoulders while you do this. 5. Hold for __________ seconds. 6. Slowly return to the starting position. Repeat __________ times. Complete this exercise __________ times a day. Exercise F: Shoulder extension   1. Sit in a stable chair without armrests, or stand. 2. Secure an exercise band to a stable object in front of you where the band is above shoulder height. 3. Hold one end of the exercise band in each hand. 4. Straighten your elbows and lift your hands up to shoulder height. 5. Squeeze your shoulder blades together and pull your hands down to the sides of your thighs. Stop when your hands are straight down by your sides. Do not let your hands go behind your body. 6. Hold for __________ seconds. 7. Slowly return to the starting position. Repeat __________ times. Complete this exercise __________ times a day. This information is not intended to replace advice given to you by your health care provider. Make sure you discuss any questions you have with your health care provider. Document Released: 03/01/2005 Document Revised: 11/06/2015 Document Reviewed: 02/01/2015 Elsevier Interactive Patient Education  2017 [place] [employer].  Shoulder Impingement Syndrome Rehab Ask your health care provider which exercises are safe for you. Do exercises exactly as told by your health care provider and adjust them as directed. It is normal to feel mild stretching, pulling, tightness, or discomfort as you do these exercises, but you should stop right away if you feel sudden pain or your pain gets worse.Do not begin these exercises until told by your health care provider. Stretching and range of motion exercise This exercise warms up your muscles and joints and improves  the movement and flexibility of your shoulder. This exercise also helps to relieve pain and stiffness. Exercise A: Passive horizontal adduction   4. Sit or stand and pull your left / right elbow across your chest, toward your other shoulder. Stop when you feel a gentle stretch in the back of your shoulder and upper arm.  Keep your arm at shoulder height.  Keep your arm as close to your body as you comfortably can. 5. Hold for __________ seconds. 6. Slowly return to the starting position. Repeat __________ times. Complete this exercise __________ times a day. Strengthening exercises These exercises build strength and endurance in your shoulder. Endurance is the ability to use your muscles for a long time, even after they get tired. Exercise B: External rotation, isometric  6. Stand or sit in a doorway, facing the door frame. 7. Bend your left / right elbow and place the back of your wrist against the door frame. Only your wrist should be touching the frame. Keep your upper arm at your side. 8. Gently press your wrist against the door frame, as if you are trying to  push your arm away from your abdomen.  Avoid shrugging your shoulder while you press your hand against the door frame. Keep your shoulder blade tucked down toward the middle of your back. 9. Hold for __________ seconds. 10. Slowly release the tension, and relax your muscles completely before you do the exercise again. Repeat __________ times. Complete this exercise __________ times a day. Exercise C: Internal rotation, isometric   6. Stand or sit in a doorway, facing the door frame. 7. Bend your left / right elbow and place the inside of your wrist against the door frame. Only your wrist should be touching the frame. Keep your upper arm at your side. 8. Gently press your wrist against the door frame, as if you are trying to push your arm toward your abdomen.  Avoid shrugging your shoulder while you press your hand against the  door frame. Keep your shoulder blade tucked down toward the middle of your back. 9. Hold for __________ seconds. 10. Slowly release the tension, and relax your muscles completely before you do the exercise again. Repeat __________ times. Complete this exercise __________ times a day. Exercise D: Scapular protraction, supine   6. Lie on your back on a firm surface. Hold a __________ weight in your left / right hand. 7. Raise your left / right arm straight into the air so your hand is directly above your shoulder joint. 8. Push the weight into the air so your shoulder lifts off of the surface that you are lying on. Do not move your head, neck, or back. 9. Hold for __________ seconds. 10. Slowly return to the starting position. Let your muscles relax completely before you repeat this exercise. Repeat __________ times. Complete this exercise __________ times a day. Exercise E: Scapular retraction   7. Sit in a stable chair without armrests, or stand. 8. Secure an exercise band to a stable object in front of you so the band is at shoulder height. 9. Hold one end of the exercise band in each hand. Your palms should face down. 10. Squeeze your shoulder blades together and move your elbows slightly behind you. Do not shrug your shoulders while you do this. 11. Hold for __________ seconds. 12. Slowly return to the starting position. Repeat __________ times. Complete this exercise __________ times a day. Exercise F: Shoulder extension   8. Sit in a stable chair without armrests, or stand. 9. Secure an exercise band to a stable object in front of you where the band is above shoulder height. 10. Hold one end of the exercise band in each hand. 11. Straighten your elbows and lift your hands up to shoulder height. 12. Squeeze your shoulder blades together and pull your hands down to the sides of your thighs. Stop when your hands are straight down by your sides. Do not let your hands go behind your  body. 13. Hold for __________ seconds. 14. Slowly return to the starting position. Repeat __________ times. Complete this exercise __________ times a day. This information is not intended to replace advice given to you by your health care provider. Make sure you discuss any questions you have with your health care provider. Document Released: 03/01/2005 Document Revised: 11/06/2015 Document Reviewed: 02/01/2015 Elsevier Interactive Patient Education  2017 [employer].

## 2016-05-13 NOTE — Progress Notes (Signed)
Pre visit review using our clinic review tool, if applicable. No additional management support is needed unless otherwise documented below in the visit note. 

## 2016-05-13 NOTE — Progress Notes (Signed)
Subjective:    Patient ID: Susan Lester, female    DOB: [birth year], 71 y.o.   MRN: [id number]  HPI 71 year old patient who is seen today for a annual preventive health examination. She has had a recent subsequent Medicare wellness visit  Her main complaint today is exacerbation of menopausal symptoms since discontinuation of HRT last year.  She has had remote hysterectomy.  She states the symptoms are fairly severe, interfering with sleep  She has a history colonic polyps and last colonoscopy was April 2012  Mammogram January 2018; another complaint is bilateral shoulder discomfort  Past Medical History:  Diagnosis Date  . COLONIC POLYPS, HX OF 02/01/2007     Social History   Social History  . Marital status: Married    Spouse name: N/A  . Number of children: N/A  . Years of education: N/A   Occupational History  . Not on file.   Social History Main Topics  . Smoking status: Never Smoker  . Smokeless tobacco: Never Used  . Alcohol use Not on file     Comment: none  . Drug use: Unknown  . Sexual activity: Not on file   Other Topics Concern  . Not on file   Social History Narrative  . No narrative on file    Past Surgical History:  Procedure Laterality Date  . ABDOMINAL HYSTERECTOMY      Family History  Problem Relation Age of Onset  . Heart disease Mother   . Hypertension Mother   . Cancer Father     lung ca  . COPD Father     No Known Allergies  No current outpatient prescriptions on file prior to visit.   No current facility-administered medications on file prior to visit.     BP 128/72 (BP Location: Left Arm, Patient Position: Sitting, Cuff Size: Normal)   Pulse 60   Temp 97.6 F (36.4 C) (Oral)   Ht 5\' 3"  (1.6 m)   Wt 182 lb 3.2 oz (82.6 kg)   SpO2 96%   BMI 32.28 kg/m     Review of Systems  Constitutional: Negative for appetite change, fatigue, fever and unexpected weight change.  HENT: Positive for tinnitus. Negative for  congestion, dental problem, ear pain, hearing loss, mouth sores, nosebleeds, sinus pressure, sore throat, trouble swallowing and voice change.   Eyes: Negative for photophobia, pain, redness and visual disturbance.  Respiratory: Negative for cough, chest tightness and shortness of breath.   Cardiovascular: Negative for chest pain, palpitations and leg swelling.  Gastrointestinal: Negative for abdominal distention, abdominal pain, blood in stool, constipation, diarrhea, nausea, rectal pain and vomiting.  Genitourinary: Negative for difficulty urinating, dysuria, flank pain, frequency, genital sores, hematuria, menstrual problem, pelvic pain, urgency, vaginal bleeding, vaginal discharge and vaginal pain.  Musculoskeletal: Positive for arthralgias. Negative for back pain and neck stiffness.       Bilateral shoulder pain  Skin: Negative for rash.  Neurological: Negative for dizziness, syncope, speech difficulty, weakness, light-headedness, numbness and headaches.  Hematological: Negative for adenopathy. Does not bruise/bleed easily.  Psychiatric/Behavioral: Negative for agitation, behavioral problems, dysphoric mood, self-injury and suicidal ideas. The patient is not nervous/anxious.        Objective:   Physical Exam  Constitutional: She is oriented to person, place, and time. She appears well-developed and well-nourished.  Blood pressure 122/72 Weight 182  HENT:  Head: Normocephalic and atraumatic.  Right Ear: External ear normal.  Left Ear: External ear normal.  Mouth/Throat: Oropharynx is clear and  moist.  Eyes: Conjunctivae and EOM are normal.  Neck: Normal range of motion. Neck supple. No JVD present. No thyromegaly present.  Cardiovascular: Normal rate, regular rhythm, normal heart sounds and intact distal pulses.   No murmur heard. Pedal pulses full  Pulmonary/Chest: Effort normal and breath sounds normal. She has no wheezes. She has no rales.  Abdominal: Soft. Bowel sounds are  normal. She exhibits no distension and no mass. There is no tenderness. There is no rebound and no guarding.  Musculoskeletal: Normal range of motion. She exhibits no edema or tenderness.  Neurological: She is alert and oriented to person, place, and time. She has normal reflexes. No cranial nerve deficit. She exhibits normal muscle tone. Coordination normal.  Skin: Skin is warm and dry. No rash noted.  Psychiatric: She has a normal mood and affect. Her behavior is normal.          Assessment & Plan:   Preventive health exam Mild obesity Bilateral shoulder pain.  Probable rotator cuff tendinopathy.  Will give some stretching exercises. History of colonic polyps.  Schedule follow-up colonoscopy History mild osteopenia.  Continue calcium and vitamin D supplements.  Consider follow-up bone density one year  Menopausal syndrome.  Will resume estradiol 1 mg daily.  We'll decrease to 1 half milligram daily.  If symptoms are controlled  Check screening lab Follow-up one year or as needed  Nyoka Cowden

## 2016-06-21 ENCOUNTER — Encounter: Payer: Self-pay | Admitting: Internal Medicine

## 2016-06-24 DIAGNOSIS — G459 Transient cerebral ischemic attack, unspecified: Secondary | ICD-10-CM | POA: Insufficient documentation

## 2016-06-25 DIAGNOSIS — I10 Essential (primary) hypertension: Secondary | ICD-10-CM | POA: Insufficient documentation

## 2016-06-25 DIAGNOSIS — E6609 Other obesity due to excess calories: Secondary | ICD-10-CM | POA: Insufficient documentation

## 2016-07-02 ENCOUNTER — Ambulatory Visit: Payer: Medicare PPO | Admitting: Internal Medicine

## 2016-07-06 ENCOUNTER — Ambulatory Visit (INDEPENDENT_AMBULATORY_CARE_PROVIDER_SITE_OTHER): Payer: Medicare PPO | Admitting: Internal Medicine

## 2016-07-06 ENCOUNTER — Encounter: Payer: Self-pay | Admitting: Internal Medicine

## 2016-07-06 VITALS — BP 140/82 | HR 70 | Temp 97.5°F | Ht 63.0 in | Wt 188.2 lb

## 2016-07-06 DIAGNOSIS — R202 Paresthesia of skin: Secondary | ICD-10-CM

## 2016-07-06 DIAGNOSIS — D329 Benign neoplasm of meninges, unspecified: Secondary | ICD-10-CM | POA: Diagnosis not present

## 2016-07-06 DIAGNOSIS — Z8673 Personal history of transient ischemic attack (TIA), and cerebral infarction without residual deficits: Secondary | ICD-10-CM

## 2016-07-06 DIAGNOSIS — R7989 Other specified abnormal findings of blood chemistry: Secondary | ICD-10-CM

## 2016-07-06 MED ORDER — ATORVASTATIN CALCIUM 10 MG PO TABS: 10.0000 mg | ORAL_TABLET | Freq: Every day | ORAL | 3 refills | Status: DC

## 2016-07-06 NOTE — Patient Instructions (Addendum)
WE NOW OFFER   [facility] FAST TRACK!!!  SAME DAY Appointments for ACUTE CARE  Such as: Sprains, Injuries, cuts, abrasions, rashes, muscle pain, joint pain, back pain Colds, flu, sore throats, headache, allergies, cough, fever  Ear pain, sinus and eye infections Abdominal pain, nausea, vomiting, diarrhea, upset stomach Animal/insect bites  3 Easy Ways to Schedule: Walk-In Scheduling Call in scheduling Mychart Sign-up: [email or web address]    Aspirin 81 mg daily Start atorvastatin 10 mg daily Discontinue hormone replacement therapy Neurology consultation as discussed     Transient Ischemic Attack A transient ischemic attack (TIA) is a "warning stroke" that causes stroke-like symptoms. A TIA does not cause lasting damage to the brain. The symptoms of a TIA can happen fast and do not last long. It is important to know the symptoms of a TIA and what to do. This can help prevent stroke or death. Follow these instructions at home:  Take medicines only as told by your doctor. Make sure you understand all of the instructions.  You may need to take aspirin or warfarin medicine. Warfarin needs to be taken exactly as told.  Taking too much or too little warfarin is dangerous. Blood tests must be done as often as told by your doctor. A PT blood test measures how long it takes for blood to clot. Your PT is used to calculate another value called an INR. Your PT and INR help your doctor adjust your warfarin dosage. He or she will make sure you are taking the right amount.  Food can cause problems with warfarin and affect the results of your blood tests. This is true for foods high in vitamin K. Eat the same amount of foods high in vitamin K each day. Foods high in vitamin K include spinach, kale, broccoli, cabbage, collard and turnip greens, Brussels sprouts, peas, cauliflower, seaweed, and parsley. Other foods high in vitamin K include beef and pork liver, green tea, and  soybean oil. Eat the same amount of foods high in vitamin K each day. Avoid big changes in your diet. Tell your doctor before changing your diet. Talk to a food specialist (dietitian) if you have questions.  Many medicines can cause problems with warfarin and affect your PT and INR. Tell your doctor about all medicines you take. This includes vitamins and dietary pills (supplements). Do not take or stop taking any prescribed or over-the-counter medicines unless your doctor tells you to.  Warfarin can cause more bruising or bleeding. Hold pressure over any cuts for longer than normal. Talk to your doctor about other side effects of warfarin.  Avoid sports or activities that may cause injury or bleeding.  Be careful when you shave, floss, or use sharp objects.  Avoid or drink very little alcohol while taking warfarin. Tell your doctor if you change how much alcohol you drink.  Tell your dentist and other doctors that you take warfarin before any procedures.  Follow your diet program as told, if you are given one.  Keep a healthy weight.  Stay active. Try to get at least 30 minutes of activity on all or most days.  Do not use any tobacco products, including cigarettes, chewing tobacco, or electronic cigarettes. If you need help quitting, ask your doctor.  Limit alcohol intake to no more than 1 drink per day for nonpregnant women and 2 drinks per day for men. One drink equals 12 ounces of beer, 5 ounces of wine, or 1 ounces of hard liquor.  Do not abuse drugs.  Keep your home safe so you do not fall. You can do this by:  Putting grab bars in the bedroom and bathroom.  Raising toilet seats.  Putting a seat in the shower.  Keep all follow-up visits as told by your doctor. This is important. Contact a doctor if:  Your personality changes.  You have trouble swallowing.  You have double vision.  You are dizzy.  You have a fever. Get help right away if: These symptoms may be an  emergency. Do not wait to see if the symptoms will go away. Get medical help right away. Call your local emergency services (911 in the [place] Do not drive yourself to the hospital.  You have sudden weakness or lose feeling (go numb), especially on one side of the body. This can affect your:  Face.  Arm.  Leg.  You have sudden trouble walking.  You have sudden trouble moving your arms or legs.  You have sudden confusion.  You have trouble talking.  You have trouble understanding.  You have sudden trouble seeing in one or both eyes.  You lose your balance.  Your movements are not smooth.  You have a sudden, very bad headache with no known cause.  You have new chest pain.  Your heartbeat is unsteady.  You are partly or totally unaware of what is going on around you. This information is not intended to replace advice given to you by your health care provider. Make sure you discuss any questions you have with your health care provider. Document Released: 12/09/2007 Document Revised: 11/03/2015 Document Reviewed: 06/06/2013 Elsevier Interactive Patient Education  2017 [employer].

## 2016-07-06 NOTE — Progress Notes (Signed)
Subjective:    Patient ID: Susan Lester, female    DOB: 02-25-1946, 71 y.o.   MRN: [id number]  HPI  Results for orders placed during the hospital encounter of 06/24/16  MRI BRAIN W WO CONTRAST  Impression 1. No evidence for acute intracranial abnormality. 2. Small benign meningioma along the extra-axial medial left temporal lobe which measures 0.6 cm in greatest dimension and has doubtful clinical significance. 3. Minimal chronic small vessel ischemic disease.    Results for orders placed during the hospital encounter of 06/24/16  US CAROTID BILATERAL  Impression No hemodynamically significant stenosis with stenosis of less than 50% bilaterally.     Procedures performed:  HPI: Susan Lester 71 y.o. female who presents with right upper extremity numbness and tingling of right upper and lower extremity  Hospital Course:    * Transient ischemic attack Assessment & Plan Patient presented with numbness and tingling of right upper and lower extremity Symptoms are resolved. MRI is negative for any acute stroke. Similarly, lipid panels are within normal limit.  Patient has mild hypertension. Patient likely has transient ischemic attack. Will send home with aspirin. Follow up with primary care physician.  Essential hypertension Assessment & Plan Patient has mild hypertension. I discussed the need for for possible blood pressure control. Her preference is to follow up with her primary care physician to determine which medications to start.  Obesity due to excess calories Assessment & Plan Recommend diet and exercise program on discharge.   71 year old patient who is seen today following a recent hospital admission.  She presented with right sided numbness involving the hand, arm, right facial area and leg. She was seen in our office for a annual exam on March 1 and states that she noticed some intermittent numbness involving the right hand at that time that she attributed to  possible mild carpal tunnel. Hormone therapy was resumed at that time per patient request  The patient states the symptoms lasted approximately 3 hours on April 12.  Evaluation included a brain MRI that revealed a 6 mm meningioma involving the left medial temporal lobe.  There was age-related small vessel changes but no evidence of acute stroke.  Carotid artery Doppler study unremarkable.  She was placed on aspirin therapy  She did well until today when she stated that she again has had some numbness involving her right arm and the upper right lip area  Past Medical History:  Diagnosis Date  . COLONIC POLYPS, HX OF 02/01/2007     Social History   Social History  . Marital status: Married    Spouse name: N/A  . Number of children: N/A  . Years of education: N/A   Occupational History  . Not on file.   Social History Main Topics  . Smoking status: Never Smoker  . Smokeless tobacco: Never Used  . Alcohol use Not on file     Comment: none  . Drug use: Unknown  . Sexual activity: Not on file   Other Topics Concern  . Not on file   Social History Narrative  . No narrative on file    Past Surgical History:  Procedure Laterality Date  . ABDOMINAL HYSTERECTOMY      Family History  Problem Relation Age of Onset  . Heart disease Mother   . Hypertension Mother   . Cancer Father     lung ca  . COPD Father     No Known Allergies  No current outpatient prescriptions on file  prior to visit.   No current facility-administered medications on file prior to visit.     BP 140/82 (BP Location: Left Arm, Patient Position: Sitting, Cuff Size: Normal)   Pulse 70   Temp 97.5 F (36.4 C) (Oral)   Ht 5\' 3"  (1.6 m)   Wt 188 lb 3.2 oz (85.4 kg)   SpO2 98%   BMI 33.34 kg/m      Review of Systems  Constitutional: Negative.   HENT: Negative for congestion, dental problem, hearing loss, rhinorrhea, sinus pressure, sore throat and tinnitus.   Eyes: Negative for pain, discharge  and visual disturbance.  Respiratory: Negative for cough and shortness of breath.   Cardiovascular: Negative for chest pain, palpitations and leg swelling.  Gastrointestinal: Negative for abdominal distention, abdominal pain, blood in stool, constipation, diarrhea, nausea and vomiting.  Genitourinary: Negative for difficulty urinating, dysuria, flank pain, frequency, hematuria, pelvic pain, urgency, vaginal bleeding, vaginal discharge and vaginal pain.  Musculoskeletal: Negative for arthralgias, gait problem and joint swelling.  Skin: Negative for rash.  Neurological: Positive for numbness. Negative for dizziness, syncope, speech difficulty, weakness and headaches.  Hematological: Negative for adenopathy.  Psychiatric/Behavioral: Negative for agitation, behavioral problems and dysphoric mood. The patient is not nervous/anxious.        Objective:   Physical Exam  Constitutional: She is oriented to person, place, and time. She appears well-developed and well-nourished.  HENT:  Head: Normocephalic.  Right Ear: External ear normal.  Left Ear: External ear normal.  Mouth/Throat: Oropharynx is clear and moist.  Eyes: Conjunctivae and EOM are normal. Pupils are equal, round, and reactive to light.  Neck: Normal range of motion. Neck supple. No thyromegaly present.  Cardiovascular: Normal rate, regular rhythm, normal heart sounds and intact distal pulses.   Pulmonary/Chest: Effort normal and breath sounds normal.  Abdominal: Soft. Bowel sounds are normal. She exhibits no mass. There is no tenderness.  Musculoskeletal: Normal range of motion.  Lymphadenopathy:    She has no cervical adenopathy.  Neurological: She is alert and oriented to person, place, and time. She has normal reflexes. No cranial nerve deficit. Coordination normal.  Normal finger to nose testing  No facial asymmetry, no drift Normal gait  Slight subjective numbness involving the right upper lip Motor exam normal      Skin: Skin is warm and dry. No rash noted.  Psychiatric: She has a normal mood and affect. Her behavior is normal.          Assessment & Plan:   Recurrent right sided numbness.  Possible TIA.  Some atypical features 6 mm left medial temporal lobe meningioma.  Unclear whether this could be causing her symptoms  Plan.  The patient will continue aspirin, hormone therapy will be discontinued.  Due to possible TIA.  Will place on low intensity statin therapy.  Neurology consultation  Susan Lester

## 2016-07-06 NOTE — Progress Notes (Signed)
Pre visit review using our clinic review tool, if applicable. No additional management support is needed unless otherwise documented below in the visit note. 

## 2016-07-08 ENCOUNTER — Encounter: Payer: Self-pay | Admitting: Neurology

## 2016-08-04 ENCOUNTER — Ambulatory Visit (INDEPENDENT_AMBULATORY_CARE_PROVIDER_SITE_OTHER): Payer: Medicare PPO | Admitting: Internal Medicine

## 2016-08-04 ENCOUNTER — Encounter: Payer: Self-pay | Admitting: Internal Medicine

## 2016-08-04 VITALS — BP 138/80 | HR 64 | Temp 98.0°F | Wt 186.6 lb

## 2016-08-04 DIAGNOSIS — R7989 Other specified abnormal findings of blood chemistry: Secondary | ICD-10-CM

## 2016-08-04 DIAGNOSIS — R946 Abnormal results of thyroid function studies: Secondary | ICD-10-CM

## 2016-08-04 DIAGNOSIS — R209 Unspecified disturbances of skin sensation: Secondary | ICD-10-CM

## 2016-08-04 DIAGNOSIS — Z860100 Personal history of colon polyps, unspecified: Secondary | ICD-10-CM

## 2016-08-04 DIAGNOSIS — IMO0001 Reserved for inherently not codable concepts without codable children: Secondary | ICD-10-CM

## 2016-08-04 NOTE — Patient Instructions (Addendum)
WE NOW OFFER   [facility] FAST TRACK!!!  SAME DAY Appointments for ACUTE CARE  Such as: Sprains, Injuries, cuts, abrasions, rashes, muscle pain, joint pain, back pain Colds, flu, sore throats, headache, allergies, cough, fever  Ear pain, sinus and eye infections Abdominal pain, nausea, vomiting, diarrhea, upset stomach Animal/insect bites  3 Easy Ways to Schedule: Walk-In Scheduling Call in scheduling Mychart Sign-up: [email or web address]   Limit your sodium (Salt) intake  Please check your blood pressure on a regular basis.  If it is consistently greater than 150/90, please make an office appointment.    It is important that you exercise regularly, at least 20 minutes 3 to 4 times per week.  If you develop chest pain or shortness of breath seek  medical attention.  You need to lose weight.  Consider a lower calorie diet and regular exercise.        DASH Eating Plan DASH stands for "Dietary Approaches to Stop Hypertension." The DASH eating plan is a healthy eating plan that has been shown to reduce high blood pressure (hypertension). It may also reduce your risk for type 2 diabetes, heart disease, and stroke. The DASH eating plan may also help with weight loss. What are tips for following this plan? General guidelines   Avoid eating more than 2,300 mg (milligrams) of salt (sodium) a day. If you have hypertension, you may need to reduce your sodium intake to 1,500 mg a day.  Limit alcohol intake to no more than 1 drink a day for nonpregnant women and 2 drinks a day for men. One drink equals 12 oz of beer, 5 oz of wine, or 1 oz of hard liquor.  Work with your health care provider to maintain a healthy body weight or to lose weight. Ask what an ideal weight is for you.  Get at least 30 minutes of exercise that causes your heart to beat faster (aerobic exercise) most days of the week. Activities may include walking, swimming, or biking.  Work with your  health care provider or diet and nutrition specialist (dietitian) to adjust your eating plan to your individual calorie needs. Reading food labels   Check food labels for the amount of sodium per serving. Choose foods with less than 5 percent of the Daily Value of sodium. Generally, foods with less than 300 mg of sodium per serving fit into this eating plan.  To find whole grains, look for the word "whole" as the first word in the ingredient list. Shopping   Buy products labeled as "low-sodium" or "no salt added."  Buy fresh foods. Avoid canned foods and premade or frozen meals. Cooking   Avoid adding salt when cooking. Use salt-free seasonings or herbs instead of table salt or sea salt. Check with your health care provider or pharmacist before using salt substitutes.  Do not fry foods. Cook foods using healthy methods such as baking, boiling, grilling, and broiling instead.  Cook with heart-healthy oils, such as olive, canola, soybean, or sunflower oil. Meal planning    Eat a balanced diet that includes:  5 or more servings of fruits and vegetables each day. At each meal, try to fill half of your plate with fruits and vegetables.  Up to 6-8 servings of whole grains each day.  Less than 6 oz of lean meat, poultry, or fish each day. A 3-oz serving of meat is about the same size as a deck of cards. One egg equals 1 oz.  2 servings of low-fat dairy each  day.  A serving of nuts, seeds, or beans 5 times each week.  Heart-healthy fats. Healthy fats called Omega-3 fatty acids are found in foods such as flaxseeds and coldwater fish, like sardines, salmon, and mackerel.  Limit how much you eat of the following:  Canned or prepackaged foods.  Food that is high in trans fat, such as fried foods.  Food that is high in saturated fat, such as fatty meat.  Sweets, desserts, sugary drinks, and other foods with added sugar.  Full-fat dairy products.  Do not salt foods before  eating.  Try to eat at least 2 vegetarian meals each week.  Eat more home-cooked food and less restaurant, buffet, and fast food.  When eating at a restaurant, ask that your food be prepared with less salt or no salt, if possible. What foods are recommended? The items listed may not be a complete list. Talk with your dietitian about what dietary choices are best for you. Grains  Whole-grain or whole-wheat bread. Whole-grain or whole-wheat pasta. Brown rice. [place]. Bulgur. Whole-grain and low-sodium cereals. Pita bread. Low-fat, low-sodium crackers. Whole-wheat flour tortillas. Vegetables  Fresh or frozen vegetables (raw, steamed, roasted, or grilled). Low-sodium or reduced-sodium tomato and vegetable juice. Low-sodium or reduced-sodium tomato sauce and tomato paste. Low-sodium or reduced-sodium canned vegetables. Fruits  All fresh, dried, or frozen fruit. Canned fruit in natural juice (without added sugar). Meat and other protein foods  Skinless chicken or [place]. Ground chicken or [place]. Pork with fat trimmed off. Fish and seafood. Egg whites. Dried beans, peas, or lentils. Unsalted nuts, nut butters, and seeds. Unsalted canned beans. Lean cuts of beef with fat trimmed off. Low-sodium, lean deli meat. Dairy  Low-fat (1%) or fat-free (skim) milk. Fat-free, low-fat, or reduced-fat cheeses. Nonfat, low-sodium ricotta or cottage cheese. Low-fat or nonfat yogurt. Low-fat, low-sodium cheese. Fats and oils  Soft margarine without trans fats. Vegetable oil. Low-fat, reduced-fat, or light mayonnaise and salad dressings (reduced-sodium). Canola, safflower, olive, soybean, and sunflower oils. Avocado. Seasoning and other foods  Herbs. Spices. Seasoning mixes without salt. Unsalted popcorn and pretzels. Fat-free sweets. What foods are not recommended? The items listed may not be a complete list. Talk with your dietitian about what dietary choices are best for you. Grains  Baked goods made  with fat, such as croissants, muffins, or some breads. Dry pasta or rice meal packs. Vegetables  Creamed or fried vegetables. Vegetables in a cheese sauce. Regular canned vegetables (not low-sodium or reduced-sodium). Regular canned tomato sauce and paste (not low-sodium or reduced-sodium). Regular tomato and vegetable juice (not low-sodium or reduced-sodium). Angie Fava. Olives. Fruits  Canned fruit in a light or heavy syrup. Fried fruit. Fruit in cream or butter sauce. Meat and other protein foods  Fatty cuts of meat. Ribs. Fried meat. Berniece Salines. Sausage. Bologna and other processed lunch meats. Salami. Fatback. Hotdogs. Bratwurst. Salted nuts and seeds. Canned beans with added salt. Canned or smoked fish. Whole eggs or egg yolks. Chicken or [place] with skin. Dairy  Whole or 2% milk, cream, and half-and-half. Whole or full-fat cream cheese. Whole-fat or sweetened yogurt. Full-fat cheese. Nondairy creamers. Whipped toppings. Processed cheese and cheese spreads. Fats and oils  Butter. Stick margarine. Lard. Shortening. Ghee. Bacon fat. Tropical oils, such as coconut, palm kernel, or palm oil. Seasoning and other foods  Salted popcorn and pretzels. Onion salt, garlic salt, seasoned salt, table salt, and sea salt. Worcestershire sauce. Tartar sauce. Barbecue sauce. Teriyaki sauce. Soy sauce, including reduced-sodium. Steak sauce. Canned and  packaged gravies. Fish sauce. Oyster sauce. Cocktail sauce. Horseradish that you find on the shelf. Ketchup. Mustard. Meat flavorings and tenderizers. Bouillon cubes. Hot sauce and Tabasco sauce. Premade or packaged marinades. Premade or packaged taco seasonings. Relishes. Regular salad dressings. Where to find more information:  National Heart, Lung, and [place]: https://wilson-eaton.com/  American Heart Association: www.heart.org Summary  The DASH eating plan is a healthy eating plan that has been shown to reduce high blood pressure (hypertension). It may also reduce  your risk for type 2 diabetes, heart disease, and stroke.  With the DASH eating plan, you should limit salt (sodium) intake to 2,300 mg a day. If you have hypertension, you may need to reduce your sodium intake to 1,500 mg a day.  When on the DASH eating plan, aim to eat more fresh fruits and vegetables, whole grains, lean proteins, low-fat dairy, and heart-healthy fats.  Work with your health care provider or diet and nutrition specialist (dietitian) to adjust your eating plan to your individual calorie needs. This information is not intended to replace advice given to you by your health care provider. Make sure you discuss any questions you have with your health care provider. Document Released: 02/18/2011 Document Revised: 02/23/2016 Document Reviewed: 02/23/2016 Elsevier Interactive Patient Education  2017 [employer].   Neurology follow-up as scheduled

## 2016-08-05 ENCOUNTER — Encounter: Payer: Self-pay | Admitting: Internal Medicine

## 2016-08-05 NOTE — Progress Notes (Signed)
Subjective:    Patient ID: Susan Lester, female    DOB: [birth year], 71 y.o.   MRN: [id number]  HPI 71 year old patient who is seen today in follow-up for possible TIA.  She presented early with right facial, right arm and right leg numbness without weakness.  Evaluation included a brain MRI that revealed a small meningioma of doubtful clinical significance as well as age-related small vessel changes. She states that she has done well over the past month , but does state that she continues to have some intermittent right-sided paresthesias.  During the course of the office visit, she states that she had some right-sided numbness.  Past Medical History:  Diagnosis Date  . COLONIC POLYPS, HX OF 02/01/2007     Social History   Social History  . Marital status: Married    Spouse name: N/A  . Number of children: N/A  . Years of education: N/A   Occupational History  . Not on file.   Social History Main Topics  . Smoking status: Never Smoker  . Smokeless tobacco: Never Used  . Alcohol use Not on file     Comment: none  . Drug use: Unknown  . Sexual activity: Not on file   Other Topics Concern  . Not on file   Social History Narrative  . No narrative on file    Past Surgical History:  Procedure Laterality Date  . ABDOMINAL HYSTERECTOMY      Family History  Problem Relation Age of Onset  . Heart disease Mother   . Hypertension Mother   . Cancer Father        lung ca  . COPD Father     No Known Allergies  Current Outpatient Prescriptions on File Prior to Visit  Medication Sig Dispense Refill  . atorvastatin (LIPITOR) 10 MG tablet Take 1 tablet (10 mg total) by mouth daily. 90 tablet 3   No current facility-administered medications on file prior to visit.     BP 138/80 (BP Location: Left Arm, Patient Position: Sitting, Cuff Size: Normal)   Pulse 64   Temp 98 F (36.7 C) (Oral)   Wt 186 lb 9.6 oz (84.6 kg)   SpO2 97%   BMI 33.05 kg/m      Review of  Systems  Constitutional: Negative.   HENT: Negative for congestion, dental problem, hearing loss, rhinorrhea, sinus pressure, sore throat and tinnitus.   Eyes: Negative for pain, discharge and visual disturbance.  Respiratory: Negative for cough and shortness of breath.   Cardiovascular: Negative for chest pain, palpitations and leg swelling.  Gastrointestinal: Negative for abdominal distention, abdominal pain, blood in stool, constipation, diarrhea, nausea and vomiting.  Genitourinary: Negative for difficulty urinating, dysuria, flank pain, frequency, hematuria, pelvic pain, urgency, vaginal bleeding, vaginal discharge and vaginal pain.  Musculoskeletal: Negative for arthralgias, gait problem and joint swelling.  Skin: Negative for rash.  Neurological: Positive for numbness. Negative for dizziness, syncope, speech difficulty, weakness and headaches.  Hematological: Negative for adenopathy.  Psychiatric/Behavioral: Negative for agitation, behavioral problems and dysphoric mood. The patient is not nervous/anxious.        Objective:   Physical Exam  Constitutional: She is oriented to person, place, and time. She appears well-developed and well-nourished.  HENT:  Head: Normocephalic.  Right Ear: External ear normal.  Left Ear: External ear normal.  Mouth/Throat: Oropharynx is clear and moist.  Eyes: Conjunctivae and EOM are normal. Pupils are equal, round, and reactive to light.  Neck: Normal range of  motion. Neck supple. No thyromegaly present.  Cardiovascular: Normal rate, regular rhythm, normal heart sounds and intact distal pulses.   Pulmonary/Chest: Effort normal and breath sounds normal.  Abdominal: Soft. Bowel sounds are normal. She exhibits no mass. There is no tenderness.  Musculoskeletal: Normal range of motion.  Lymphadenopathy:    She has no cervical adenopathy.  Neurological: She is alert and oriented to person, place, and time. She has normal reflexes. No cranial nerve  deficit. Coordination abnormal.  No exam normal No objective or subjective abnormalities in sensory exam  Skin: Skin is warm and dry. No rash noted.  Psychiatric: She has a normal mood and affect. Her behavior is normal.          Assessment & Plan:   Intermittent paresthesias.  Patient presently is on aspirin and low intensity statin therapy initiated earlier for possible TIAs.  This diagnosis seems less likely.  Patient does have neurology consultation scheduled No change in medical regimen at this time  Neurology follow-up Patient report any new or worsening symptoms  Nyoka Cowden

## 2016-08-18 ENCOUNTER — Ambulatory Visit: Payer: Medicare PPO | Admitting: Internal Medicine

## 2016-09-13 ENCOUNTER — Telehealth: Payer: Self-pay | Admitting: Neurology

## 2016-09-13 ENCOUNTER — Encounter: Payer: Self-pay | Admitting: Neurology

## 2016-09-13 ENCOUNTER — Ambulatory Visit (INDEPENDENT_AMBULATORY_CARE_PROVIDER_SITE_OTHER): Payer: Medicare PPO | Admitting: Neurology

## 2016-09-13 VITALS — BP 140/80 | HR 76 | Ht 63.0 in | Wt 185.6 lb

## 2016-09-13 DIAGNOSIS — R200 Anesthesia of skin: Secondary | ICD-10-CM

## 2016-09-13 DIAGNOSIS — R202 Paresthesia of skin: Secondary | ICD-10-CM | POA: Diagnosis not present

## 2016-09-13 DIAGNOSIS — G459 Transient cerebral ischemic attack, unspecified: Secondary | ICD-10-CM

## 2016-09-13 NOTE — Telephone Encounter (Signed)
[facility]aging dept. left a message regarding a copy of an MRI for PT and would like a call back at [phone]

## 2016-09-13 NOTE — Progress Notes (Signed)
[facility] Clinic Note - Initial Visit   Date: 09/13/16  Susan Lester MRN: [id number] DOB: 06-17-1945   Dear Dr. Burnice Logan:  Thank you for your kind referral of Susan Lester for consultation of right arm and leg numbnes. Although her history is well known to you, please allow [place] to reiterate it for the purpose of our medical record. The patient was accompanied to the clinic by daughter and granddaughter who also provides collateral information.     History of Present Illness: Susan Lester is a 71 y.o. right-handed Caucasian female with no prior medical history presenting for evaluation of right sided numbness.    Starting in March 2018, she was walking and developed numbness over the right upper and lower lips as well as right arm/forearm and entire right leg.   There was no associated facial weakness or limb weakness.  Symptoms lasted about 3 hours.  This occurred again on April 12th which again lasted 3 hours and went to the ER in [place] where she was admitted over night and had MRI brain which showed  6 mm meningioma involving the left medial temporal lobe.   There was age-related small vessel changes but no evidence of acute stroke.  Ultrasound of the carotid arteries was unremarkable.  She was started on aspirin 81mg  daily and lipitor 10mg .    Since then, she had daily spells of right sided numbness which lasts several minutes to hours. She denies loss of consciousness, change in behavior, confusion, or weakness. She had not identified any triggers or alleviating factors.  She is very active, rides horses, and stays busy working her at home.    She has been on estridol for many years as hormone replacement and has tried discontinuing this in the past, but develops severe hot flashes.  At her last visit with her PCP, discontinuation of this was discussed again due to concern of ischemic event, but patient does not wish to stop this.    Out-side  paper records, electronic medical record, and images have been reviewed where available and summarized as:  MRI BRAIN W WO CONTRAST  06/24/2016: Impression  1. No evidence for acute intracranial abnormality. 2. Small benign meningioma along the extra-axial medial left temporal lobe which measures 0.6 cm in greatest dimension and has doubtful clinical significance. 3. Minimal chronic small vessel ischemic disease.   US CAROTID BILATERAL 06/24/2016:  Impression No hemodynamically significant stenosis with stenosis of less than 50% bilaterally.   Labs 06/25/2016:  TG 89  Chol 172  LDL 100 HDL 53.8  Past Medical History:  Diagnosis Date  . COLONIC POLYPS, HX OF 02/01/2007    Past Surgical History:  Procedure Laterality Date  . ABDOMINAL HYSTERECTOMY       Medications:  Outpatient Encounter Prescriptions as of 09/13/2016  Medication Sig  . aspirin 81 MG chewable tablet Chew 81 mg by mouth daily.  Marland Kitchen atorvastatin (LIPITOR) 10 MG tablet Take 1 tablet (10 mg total) by mouth daily.  Marland Kitchen estradiol (ESTRACE) 1 MG tablet take one po qod   No facility-administered encounter medications on file as of 09/13/2016.      Allergies: No Known Allergies  Family History: Family History  Problem Relation Age of Onset  . Heart disease Mother   . Hypertension Mother   . Cancer Father        lung ca  . COPD Father   . Diabetes Mellitus II Father   . Heart disease Brother  Social History: Social History  Substance Use Topics  . Smoking status: Never Smoker  . Smokeless tobacco: Never Used  . Alcohol use No     Comment: none   Social History   Social History Narrative   Lives with husband in a one story home.  She has 2 grown children.     Retired.     Education: high school.     Review of Systems:  CONSTITUTIONAL: No fevers, chills, night sweats, or weight loss.   EYES: No visual changes or eye pain ENT: No hearing changes.  No history of nose bleeds.   RESPIRATORY: No cough,  wheezing and shortness of breath.   CARDIOVASCULAR: Negative for chest pain, and palpitations.   GI: Negative for abdominal discomfort, blood in stools or black stools.  No recent change in bowel habits.   GU:  No history of incontinence.   MUSCLOSKELETAL: No history of joint pain or swelling.  No myalgias.   SKIN: Negative for lesions, rash, and itching.   HEMATOLOGY/ONCOLOGY: Negative for prolonged bleeding, bruising easily, and swollen nodes.  No history of cancer.   ENDOCRINE: Negative for cold or heat intolerance, polydipsia or goiter.   PSYCH:  No depression or anxiety symptoms.   NEURO: As Above.   Vital Signs:  BP 140/80   Pulse 76   Ht 5\' 3"  (1.6 m)   Wt 185 lb 9 oz (84.2 kg)   SpO2 96%   BMI 32.87 kg/m  Pain Scale: 0 on a scale of 0-10   General Medical Exam:   General:  Well appearing, comfortable.   Eyes/ENT: see cranial nerve examination.   Neck: No masses appreciated.  Full range of motion without tenderness.  No carotid bruits. Respiratory:  Clear to auscultation, good air entry bilaterally.   Cardiac:  Regular rate and rhythm, no murmur.   Extremities:  No deformities, edema, or skin discoloration.  Skin:  No rashes or lesions.  Neurological Exam: MENTAL STATUS including orientation to time, place, person, recent and remote memory, attention span and concentration, language, and fund of knowledge is normal.  Speech is not dysarthric.  CRANIAL NERVES: II:  No visual field defects.  Unremarkable fundi.   III-IV-VI: Pupils equal round and reactive to light.  Normal conjugate, extra-ocular eye movements in all directions of gaze.  No nystagmus.  No ptosis.   V:  Normal facial sensation.    VII:  Normal facial symmetry and movements.  No pathologic facial reflexes.  VIII:  Normal hearing and vestibular function.   IX-X:  Normal palatal movement.   XI:  Normal shoulder shrug and head rotation.   XII:  Normal tongue strength and range of motion, no deviation or  fasciculation.  MOTOR:  No atrophy, fasciculations or abnormal movements.  No pronator drift.  Tone is normal.    Right Upper Extremity:    Left Upper Extremity:    Deltoid  5/5   Deltoid  5/5   Biceps  5/5   Biceps  5/5   Triceps  5/5   Triceps  5/5   Wrist extensors  5/5   Wrist extensors  5/5   Wrist flexors  5/5   Wrist flexors  5/5   Finger extensors  5/5   Finger extensors  5/5   Finger flexors  5/5   Finger flexors  5/5   Dorsal interossei  5/5   Dorsal interossei  5/5   Abductor pollicis  5/5   Abductor pollicis  5/5  Tone (Ashworth scale)  0  Tone (Ashworth scale)  0   Right Lower Extremity:    Left Lower Extremity:    Hip flexors  5/5   Hip flexors  5/5   Hip extensors  5/5   Hip extensors  5/5   Knee flexors  5/5   Knee flexors  5/5   Knee extensors  5/5   Knee extensors  5/5   Dorsiflexors  5/5   Dorsiflexors  5/5   Plantarflexors  5/5   Plantarflexors  5/5   Toe extensors  5/5   Toe extensors  5/5   Toe flexors  5/5   Toe flexors  5/5   Tone (Ashworth scale)  0  Tone (Ashworth scale)  0   MSRs:  Right                                                                 Left brachioradialis 2+  brachioradialis 2+  biceps 2+  biceps 2+  triceps 2+  triceps 2+  patellar 2+  patellar 2+  ankle jerk 2+  ankle jerk 2+  Hoffman no  Hoffman no  plantar response down  plantar response down   SENSORY:  Normal and symmetric perception of light touch, pinprick, vibration, and proprioception.  Romberg's sign absent.   COORDINATION/GAIT: Normal finger-to- nose-finger and heel-to-shin.  Intact rapid alternating movements bilaterally.  Able to rise from a chair without using arms.  Gait narrow based and stable. Tandem and stressed gait intact.    IMPRESSION: This is a 71 year-old female referred for evaluation of stereotyped spells of right face, arm, and leg paresthesias, lasting minutes to hours.  She was evaluated in the ER for these symptoms in April 2018 with MRI brain which  showed 39mm left temporal meningioma and no acute infarct.  US carotids was normal.  She was started in aspirin 81mg  and lipitor 10mg  daily as secondary stroke prevention, but continues to have these spells.  I would like to personally review her MRI brain to assess if there is any chance that her meningioma could be a symptomatic (seizures?), but with her sensory complaints, I would expect to see structural changes involving the parietal region and spells are too long.  If her EEG is negative, the next step is to do more of a stroke work-up and obtain intracranial imaging with CTA head and obtain a holter monitor.  Going forward, discontinuation of her hormonal therapy will need to be readdressed, if this turns out to represent cerebrovascular disease.   Further recommendations will be based on her testing   The duration of this appointment visit was 50 minutes of face-to-face time with the patient.  Greater than 50% of this time was spent in counseling, explanation of diagnosis, planning of further management, and coordination of care.   Thank you for allowing me to participate in patient's care.  If I can answer any additional questions, I would be pleased to do so.    Sincerely,    Kaliya Shreiner K. Posey Pronto, DO

## 2016-09-13 NOTE — Patient Instructions (Addendum)
Please send CD images of your MRI brain  Routine EEG  Continue your medications as you are taking  We will call you with the results and let you know the next step

## 2016-09-13 NOTE — Telephone Encounter (Signed)
Request sent for MRI CD and report per patient's request.  Request faxed to [facility].

## 2016-09-14 ENCOUNTER — Ambulatory Visit (INDEPENDENT_AMBULATORY_CARE_PROVIDER_SITE_OTHER): Payer: Medicare PPO | Admitting: Neurology

## 2016-09-14 DIAGNOSIS — R200 Anesthesia of skin: Secondary | ICD-10-CM | POA: Diagnosis not present

## 2016-09-17 NOTE — Procedures (Signed)
ELECTROENCEPHALOGRAM REPORT  Date of Study: 09/14/2016  Patient's Name: Susan Lester MRN: [id number] Date of Birth: 1945-04-12  Referring Provider: Dr. Narda Amber  Clinical History: This is a 71 year old woman with episodes of right face, arm, and leg paresthesias lasting minutes to hours.   Medications: Aspirin, Lipitor, Estrace  Technical Summary: A multichannel digital EEG recording measured by the international 10-20 system with electrodes applied with paste and impedances below 5000 ohms performed in our laboratory with EKG monitoring in an awake and asleep patient.  Hyperventilation and photic stimulation were performed.  The digital EEG was referentially recorded, reformatted, and digitally filtered in a variety of bipolar and referential montages for optimal display.    Description: The patient is awake and asleep during the recording.  During maximal wakefulness, there is a symmetric, medium voltage 10 Hz posterior dominant rhythm that attenuates with eye opening.  The record is symmetric.  During drowsiness and sleep, there is an increase in theta slowing of the background.  Vertex waves and symmetric sleep spindles were seen.  Hyperventilation and photic stimulation did not elicit any abnormalities.  There were no epileptiform discharges or electrographic seizures seen.    EKG lead showed sinus bradycardia at 54 bpm.  Impression: This awake and asleep EEG is normal.    Clinical Correlation: A normal EEG does not exclude a clinical diagnosis of epilepsy.  If further clinical questions remain, prolonged EEG may be helpful.  Clinical correlation is advised.   Ellouise Newer, M.D.

## 2016-09-20 ENCOUNTER — Encounter: Payer: Self-pay | Admitting: *Deleted

## 2016-09-20 ENCOUNTER — Telehealth: Payer: Self-pay | Admitting: *Deleted

## 2016-09-20 NOTE — Telephone Encounter (Signed)
Attempted to contact patient.  No answer and no voicemail. 

## 2016-09-20 NOTE — Telephone Encounter (Signed)
-----   Message from Alda Berthold, DO sent at 09/17/2016  1:10 PM EDT ----- Please inform patient that EEG looks normal, but I would like her to have an extended EEG to try to capture these spells better.  If agreeable, please order ambulatory EEG. I also recommend that we look at her blood flow into the brain with CTA head.  Thanks.

## 2016-09-21 ENCOUNTER — Telehealth: Payer: Self-pay | Admitting: *Deleted

## 2016-09-21 NOTE — Telephone Encounter (Signed)
Left message for patient's daughter to call me back ?

## 2016-09-21 NOTE — Telephone Encounter (Signed)
-----   Message from Alda Berthold, DO sent at 09/17/2016  1:10 PM EDT ----- Please inform patient that EEG looks normal, but I would like her to have an extended EEG to try to capture these spells better.  If agreeable, please order ambulatory EEG. I also recommend that we look at her blood flow into the brain with CTA head.  Thanks.

## 2016-09-21 NOTE — Progress Notes (Signed)
I have called patient x 2.  No answer and no voicemail.  Sent message via My Chart also.

## 2016-09-22 ENCOUNTER — Other Ambulatory Visit: Payer: Self-pay | Admitting: *Deleted

## 2016-09-22 DIAGNOSIS — G459 Transient cerebral ischemic attack, unspecified: Secondary | ICD-10-CM

## 2016-09-22 DIAGNOSIS — R200 Anesthesia of skin: Secondary | ICD-10-CM

## 2016-09-22 DIAGNOSIS — R202 Paresthesia of skin: Secondary | ICD-10-CM

## 2016-09-23 ENCOUNTER — Other Ambulatory Visit: Payer: Self-pay | Admitting: *Deleted

## 2016-09-23 DIAGNOSIS — R202 Paresthesia of skin: Secondary | ICD-10-CM

## 2016-09-23 DIAGNOSIS — G459 Transient cerebral ischemic attack, unspecified: Secondary | ICD-10-CM

## 2016-09-23 DIAGNOSIS — R200 Anesthesia of skin: Secondary | ICD-10-CM

## 2016-11-01 ENCOUNTER — Ambulatory Visit (INDEPENDENT_AMBULATORY_CARE_PROVIDER_SITE_OTHER): Payer: Medicare PPO | Admitting: Neurology

## 2016-11-01 DIAGNOSIS — R202 Paresthesia of skin: Secondary | ICD-10-CM

## 2016-11-01 DIAGNOSIS — R200 Anesthesia of skin: Secondary | ICD-10-CM | POA: Diagnosis not present

## 2016-11-01 DIAGNOSIS — G459 Transient cerebral ischemic attack, unspecified: Secondary | ICD-10-CM

## 2016-11-02 DIAGNOSIS — R200 Anesthesia of skin: Secondary | ICD-10-CM | POA: Diagnosis not present

## 2016-11-09 ENCOUNTER — Other Ambulatory Visit: Payer: Self-pay | Admitting: *Deleted

## 2016-11-09 ENCOUNTER — Telehealth: Payer: Self-pay | Admitting: *Deleted

## 2016-11-09 DIAGNOSIS — R202 Paresthesia of skin: Secondary | ICD-10-CM

## 2016-11-09 DIAGNOSIS — R200 Anesthesia of skin: Secondary | ICD-10-CM

## 2016-11-09 DIAGNOSIS — G459 Transient cerebral ischemic attack, unspecified: Secondary | ICD-10-CM

## 2016-11-09 NOTE — Procedures (Signed)
ELECTROENCEPHALOGRAM REPORT  Dates of Recording: 11/01/2016 10:19AM to 11/03/2016 9:35AM  Patient's Name: Susan Lester MRN: [id number] Date of Birth: 10/30/1945  Referring Provider: Dr. Narda Amber  Procedure: 48-hour ambulatory EEG  History: This is a 71 year old woman with stereotyped spells of right face, arm, leg paresthesias lasting minutes to hours.   Medications: aspirin, Lipitor, Estrace  Technical Summary: This is a 48-hour multichannel digital EEG recording measured by the international 10-20 system with electrodes applied with paste and impedances below 5000 ohms performed as portable with EKG monitoring.  The digital EEG was referentially recorded, reformatted, and digitally filtered in a variety of bipolar and referential montages for optimal display.    DESCRIPTION OF RECORDING: During maximal wakefulness, the background activity consisted of a symmetric 9.5 Hz posterior dominant rhythm which was reactive to eye opening.  There were no epileptiform discharges or focal slowing seen in wakefulness.  During the recording, the patient progresses through wakefulness, drowsiness, and Stage 2 sleep.  Again, there were no epileptiform discharges seen.  Events: On 08/20 at 1035 hours, she reported numbness while EEG being applied, and continued until after she left office. Electrographically, there were no EEG or EKG changes seen.  There were no electrographic seizures seen.  EKG lead was unremarkable.  IMPRESSION: This 48-hour ambulatory EEG study is normal.    CLINICAL CORRELATION: A normal EEG does not exclude a clinical diagnosis of epilepsy. Patient had one episode of numbness with no EEG correlate. If further clinical questions remain, inpatient video EEG monitoring may be helpful.   Ellouise Newer, M.D.

## 2016-11-09 NOTE — Telephone Encounter (Signed)
-----   Message from Alda Berthold, DO sent at 11/09/2016  9:55 AM EDT ----- Please inform patient that her EEG was normal.  Can you also follow-up on her CTA head? I see it ordered, but not scheduled.  Thanks.

## 2016-11-09 NOTE — Telephone Encounter (Signed)
Set up the appointment for CTA on 11-11-16 at 1:45.  Patient's husband notified.  Lab work today or Architectural technologist at [facility].  Instructions for NPO (solids only) 2 hours prior given.

## 2016-11-11 ENCOUNTER — Ambulatory Visit (INDEPENDENT_AMBULATORY_CARE_PROVIDER_SITE_OTHER)
Admission: RE | Admit: 2016-11-11 | Discharge: 2016-11-11 | Disposition: A | Discharge status: Home / Self Care | Payer: Medicare PPO | Source: Ambulatory Visit | Attending: Neurology | Admitting: Neurology

## 2016-11-11 ENCOUNTER — Other Ambulatory Visit (INDEPENDENT_AMBULATORY_CARE_PROVIDER_SITE_OTHER): Payer: Medicare PPO

## 2016-11-11 DIAGNOSIS — G459 Transient cerebral ischemic attack, unspecified: Secondary | ICD-10-CM

## 2016-11-11 DIAGNOSIS — R202 Paresthesia of skin: Secondary | ICD-10-CM

## 2016-11-11 DIAGNOSIS — R200 Anesthesia of skin: Secondary | ICD-10-CM

## 2016-11-11 LAB — BASIC METABOLIC PANEL
BUN: 20 mg/dL (ref 6–23)
CALCIUM: 8.9 mg/dL (ref 8.4–10.5)
CHLORIDE: 105 meq/L (ref 96–112)
CO2: 27 mEq/L (ref 19–32)
CREATININE: 0.93 mg/dL (ref 0.40–1.20)
GFR: 63.05 mL/min (ref >60.00)
Glucose, Bld: 108 mg/dL — ABNORMAL HIGH (ref 70–99)
Potassium: 4.4 mEq/L (ref 3.5–5.1)
SODIUM: 138 meq/L (ref 135–145)

## 2016-11-11 MED ORDER — IOPAMIDOL (ISOVUE-370) INJECTION 76%
80.0000 mL | Freq: Once | INTRAVENOUS | Status: AC | PRN
  Administered 2016-11-11: 80 mL via INTRAVENOUS

## 2016-11-16 ENCOUNTER — Telehealth: Payer: Self-pay | Admitting: *Deleted

## 2016-11-16 NOTE — Telephone Encounter (Signed)
Called patient and left message for her to call me back.

## 2016-11-16 NOTE — Telephone Encounter (Signed)
-----   Message from Alda Berthold, DO sent at 11/12/2016  3:02 PM EDT ----- Please inform patient that her blood vessels to the head are normal. If she is continuing to have tingling of the right side, I would recommend starting gabapentin 300 mg at bedtime for one week and then increase to 300 mg 1 tablet twice daily. Thanks.

## 2016-11-19 ENCOUNTER — Telehealth: Payer: Self-pay | Admitting: *Deleted

## 2016-11-19 ENCOUNTER — Encounter: Payer: Self-pay | Admitting: *Deleted

## 2016-11-19 NOTE — Telephone Encounter (Signed)
Message sent via My Chart.  Instructed patient to call if she needs to start the gabapentin.

## 2016-11-19 NOTE — Telephone Encounter (Signed)
-----   Message from Alda Berthold, DO sent at 11/12/2016  3:02 PM EDT ----- Please inform patient that her blood vessels to the head are normal. If she is continuing to have tingling of the right side, I would recommend starting gabapentin 300 mg at bedtime for one week and then increase to 300 mg 1 tablet twice daily. Thanks.

## 2016-12-02 ENCOUNTER — Encounter: Payer: Self-pay | Admitting: Internal Medicine

## 2017-02-07 ENCOUNTER — Encounter: Payer: Self-pay | Admitting: Internal Medicine

## 2017-02-07 ENCOUNTER — Ambulatory Visit: Payer: Medicare PPO | Admitting: Internal Medicine

## 2017-02-07 VITALS — BP 150/72 | HR 68 | Temp 97.9°F | Ht 63.0 in | Wt 187.8 lb

## 2017-02-07 DIAGNOSIS — Z86010 Personal history of colonic polyps: Secondary | ICD-10-CM

## 2017-02-07 DIAGNOSIS — E785 Hyperlipidemia, unspecified: Secondary | ICD-10-CM | POA: Diagnosis not present

## 2017-02-07 DIAGNOSIS — R7989 Other specified abnormal findings of blood chemistry: Secondary | ICD-10-CM

## 2017-02-07 NOTE — Patient Instructions (Signed)
Limit your sodium (Salt) intake  Please check your blood pressure on a regular basis.  If it is consistently greater than 140/90, please make an office appointment.  Return in 6 months for follow-up  

## 2017-02-07 NOTE — Progress Notes (Signed)
Subjective:    Patient ID: Susan Lester, female    DOB: 08-03-1945, 71 y.o.   MRN: [id number]  HPI  71 year old patient who is seen today in follow-up.  She has been evaluated by neurology due to right sided paresthesias.  These have largely resolved.  She is on low-dose aspirin and statin therapy however.  She is on estradiol 2 times per week for menopausal symptoms Doing well without other concerns or complaints  Past Medical History:  Diagnosis Date  . COLONIC POLYPS, HX OF 02/01/2007     Social History   Socioeconomic History  . Marital status: Married    Spouse name: Not on file  . Number of children: 2  . Years of education: 92  . Highest education level: Not on file  Social Needs  . Financial resource strain: Not on file  . Food insecurity - worry: Not on file  . Food insecurity - inability: Not on file  . Transportation needs - medical: Not on file  . Transportation needs - non-medical: Not on file  Occupational History  . Not on file  Tobacco Use  . Smoking status: Never Smoker  . Smokeless tobacco: Never Used  Substance and Sexual Activity  . Alcohol use: No    Comment: none  . Drug use: No  . Sexual activity: Not on file  Other Topics Concern  . Not on file  Social History Narrative   Lives with husband in a one story home.  She has 2 grown children.     Retired.     Education: high school.     Past Surgical History:  Procedure Laterality Date  . ABDOMINAL HYSTERECTOMY      Family History  Problem Relation Age of Onset  . Heart disease Mother   . Hypertension Mother   . Cancer Father        lung ca  . COPD Father   . Diabetes Mellitus II Father   . Heart disease Brother     No Known Allergies  Current Outpatient Medications on File Prior to Visit  Medication Sig Dispense Refill  . aspirin 81 MG chewable tablet Chew 81 mg by mouth daily.    Marland Kitchen atorvastatin (LIPITOR) 10 MG tablet Take 1 tablet (10 mg total) by mouth daily. 90 tablet 3    . estradiol (ESTRACE) 1 MG tablet take one po qod  4   No current facility-administered medications on file prior to visit.     BP (!) 150/72 (BP Location: Left Arm, Patient Position: Sitting, Cuff Size: Normal)   Pulse 68   Temp 97.9 F (36.6 C) (Oral)   Ht 5\' 3"  (1.6 m)   Wt 187 lb 12.8 oz (85.2 kg)   SpO2 97%   BMI 33.27 kg/m     Review of Systems  Constitutional: Negative.   HENT: Negative for congestion, dental problem, hearing loss, rhinorrhea, sinus pressure, sore throat and tinnitus.   Eyes: Negative for pain, discharge and visual disturbance.  Respiratory: Negative for cough and shortness of breath.   Cardiovascular: Negative for chest pain, palpitations and leg swelling.  Gastrointestinal: Negative for abdominal distention, abdominal pain, blood in stool, constipation, diarrhea, nausea and vomiting.  Genitourinary: Negative for difficulty urinating, dysuria, flank pain, frequency, hematuria, pelvic pain, urgency, vaginal bleeding, vaginal discharge and vaginal pain.  Musculoskeletal: Negative for arthralgias, gait problem and joint swelling.  Skin: Negative for rash.  Neurological: Positive for numbness. Negative for dizziness, syncope, speech difficulty, weakness and  headaches.  Hematological: Negative for adenopathy.  Psychiatric/Behavioral: Negative for agitation, behavioral problems and dysphoric mood. The patient is not nervous/anxious.        Objective:   Physical Exam  Constitutional: She is oriented to person, place, and time. She appears well-developed and well-nourished.  HENT:  Head: Normocephalic.  Right Ear: External ear normal.  Left Ear: External ear normal.  Mouth/Throat: Oropharynx is clear and moist.  Eyes: Conjunctivae and EOM are normal. Pupils are equal, round, and reactive to light.  Neck: Normal range of motion. Neck supple. No thyromegaly present.  Cardiovascular: Normal rate, regular rhythm, normal heart sounds and intact distal pulses.   Pulmonary/Chest: Effort normal and breath sounds normal.  Abdominal: Soft. Bowel sounds are normal. She exhibits no mass. There is no tenderness.  Musculoskeletal: Normal range of motion.  Lymphadenopathy:    She has no cervical adenopathy.  Neurological: She is alert and oriented to person, place, and time.  Skin: Skin is warm and dry. No rash noted.  Psychiatric: She has a normal mood and affect. Her behavior is normal.          Assessment & Plan:   History of right-sided paresthesias stable Dyslipidemia continue statin therapy  Low-salt diet and home blood pressure monitoring encouraged CPX 6 months  Firas Guardado Pilar Plate

## 2017-05-25 ENCOUNTER — Other Ambulatory Visit: Payer: Self-pay | Admitting: Internal Medicine

## 2017-06-03 ENCOUNTER — Other Ambulatory Visit: Payer: Self-pay

## 2017-07-04 ENCOUNTER — Other Ambulatory Visit: Payer: Self-pay | Admitting: Internal Medicine

## 2017-08-09 ENCOUNTER — Encounter: Payer: Medicare PPO | Admitting: Internal Medicine

## 2017-08-09 ENCOUNTER — Ambulatory Visit (INDEPENDENT_AMBULATORY_CARE_PROVIDER_SITE_OTHER): Payer: Medicare PPO | Admitting: Internal Medicine

## 2017-08-09 ENCOUNTER — Encounter: Payer: Self-pay | Admitting: Internal Medicine

## 2017-08-09 VITALS — BP 160/70 | HR 95 | Temp 97.9°F | Ht 63.0 in | Wt 184.0 lb

## 2017-08-09 DIAGNOSIS — R7989 Other specified abnormal findings of blood chemistry: Secondary | ICD-10-CM | POA: Diagnosis not present

## 2017-08-09 DIAGNOSIS — Z86010 Personal history of colonic polyps: Secondary | ICD-10-CM | POA: Diagnosis not present

## 2017-08-09 DIAGNOSIS — E785 Hyperlipidemia, unspecified: Secondary | ICD-10-CM | POA: Diagnosis not present

## 2017-08-09 DIAGNOSIS — G459 Transient cerebral ischemic attack, unspecified: Secondary | ICD-10-CM | POA: Diagnosis not present

## 2017-08-09 DIAGNOSIS — I10 Essential (primary) hypertension: Secondary | ICD-10-CM | POA: Diagnosis not present

## 2017-08-09 DIAGNOSIS — Z0000 Encounter for general adult medical examination without abnormal findings: Secondary | ICD-10-CM | POA: Diagnosis not present

## 2017-08-09 LAB — CBC WITH DIFFERENTIAL/PLATELET
BASOS ABS: 0 10*3/uL (ref 0.0–0.1)
BASOS PCT: 0.3 % (ref 0.0–3.0)
Eosinophils Absolute: 0.2 10*3/uL (ref 0.0–0.7)
Eosinophils Relative: 2.2 % (ref 0.0–5.0)
HCT: 40.2 % (ref 36.0–46.0)
HEMOGLOBIN: 13.5 g/dL (ref 12.0–15.0)
LYMPHS PCT: 31.3 % (ref 12.0–46.0)
Lymphs Abs: 2.4 10*3/uL (ref 0.7–4.0)
MCHC: 33.6 g/dL (ref 30.0–36.0)
MCV: 87.8 fl (ref 78.0–100.0)
Monocytes Absolute: 0.4 10*3/uL (ref 0.1–1.0)
Monocytes Relative: 5.8 % (ref 3.0–12.0)
NEUTROS ABS: 4.6 10*3/uL (ref 1.4–7.7)
Neutrophils Relative %: 60.4 % (ref 43.0–77.0)
PLATELETS: 226 10*3/uL (ref 150.0–400.0)
RBC: 4.58 Mil/uL (ref 3.87–5.11)
RDW: 13.5 % (ref 11.5–15.5)
WBC: 7.7 10*3/uL (ref 4.0–10.5)

## 2017-08-09 LAB — COMPREHENSIVE METABOLIC PANEL
ALT: 9 U/L (ref 0–35)
AST: 13 U/L (ref 0–37)
Albumin: 3.9 g/dL (ref 3.5–5.2)
Alkaline Phosphatase: 73 U/L (ref 39–117)
BILIRUBIN TOTAL: 0.6 mg/dL (ref 0.2–1.2)
BUN: 19 mg/dL (ref 6–23)
CALCIUM: 9.2 mg/dL (ref 8.4–10.5)
CHLORIDE: 103 meq/L (ref 96–112)
CO2: 27 meq/L (ref 19–32)
Creatinine, Ser: 0.94 mg/dL (ref 0.40–1.20)
GFR: 62.15 mL/min (ref >60.00)
Glucose, Bld: 93 mg/dL (ref 70–99)
Potassium: 4.2 mEq/L (ref 3.5–5.1)
Sodium: 139 mEq/L (ref 135–145)
Total Protein: 7.4 g/dL (ref 6.0–8.3)

## 2017-08-09 LAB — LIPID PANEL
CHOL/HDL RATIO: 3
Cholesterol: 178 mg/dL (ref 0–200)
HDL: 61.5 mg/dL (ref >39.00)
LDL CALC: 93 mg/dL (ref 0–99)
NonHDL: 116.47
TRIGLYCERIDES: 117 mg/dL (ref 0.0–149.0)
VLDL: 23.4 mg/dL (ref 0.0–40.0)

## 2017-08-09 LAB — TSH: TSH: 3.12 u[IU]/mL (ref 0.35–4.50)

## 2017-08-09 NOTE — Progress Notes (Signed)
Subjective:    Patient ID: Susan Lester, female    DOB: 10-05-1945, 72 y.o.   MRN: [id number]  HPI  72 year old patient who is seen today for a comprehensive annual exam as well as a Medicare wellness visit She had a neurological evaluation last fall due to right sided numbness.  This has been stable and she only notes rare intermittent numbness.  No new or progressive neurological symptoms.  She is now on daily aspirin and statin therapy. She does have a history of menopausal syndrome and has decreased estradiol to 1 tablet 2 or 3 times per week  She has a history of colonic polyps and has had a recent follow-up colonoscopy.  Past Medical History:  Diagnosis Date  . COLONIC POLYPS, HX OF 02/01/2007     Social History   Socioeconomic History  . Marital status: Married    Spouse name: Not on file  . Number of children: 2  . Years of education: 4  . Highest education level: Not on file  Occupational History  . Not on file  Social Needs  . Financial resource strain: Not on file  . Food insecurity:    Worry: Not on file    Inability: Not on file  . Transportation needs:    Medical: Not on file    Non-medical: Not on file  Tobacco Use  . Smoking status: Never Smoker  . Smokeless tobacco: Never Used  Substance and Sexual Activity  . Alcohol use: No    Comment: none  . Drug use: No  . Sexual activity: Not on file  Lifestyle  . Physical activity:    Days per week: Not on file    Minutes per session: Not on file  . Stress: Not on file  Relationships  . Social connections:    Talks on phone: Not on file    Gets together: Not on file    Attends religious service: Not on file    Active member of club or organization: Not on file    Attends meetings of clubs or organizations: Not on file    Relationship status: Not on file  . Intimate partner violence:    Fear of current or ex partner: Not on file    Emotionally abused: Not on file    Physically abused: Not on file     Forced sexual activity: Not on file  Other Topics Concern  . Not on file  Social History Narrative   Lives with husband in a one story home.  She has 2 grown children.     Retired.     Education: high school.     Past Surgical History:  Procedure Laterality Date  . ABDOMINAL HYSTERECTOMY      Family History  Problem Relation Age of Onset  . Heart disease Mother   . Hypertension Mother   . Cancer Father        lung ca  . COPD Father   . Diabetes Mellitus II Father   . Heart disease Brother     No Known Allergies  Current Outpatient Medications on File Prior to Visit  Medication Sig Dispense Refill  . aspirin 81 MG chewable tablet Chew 81 mg by mouth daily.    Marland Kitchen atorvastatin (LIPITOR) 10 MG tablet TAKE 1 TABLET BY MOUTH EVERY DAY 90 tablet 3  . estradiol (ESTRACE) 1 MG tablet take one po qod  4   No current facility-administered medications on file prior to visit.  BP (!) 160/70 (BP Location: Right Arm, Patient Position: Sitting, Cuff Size: Large)   Pulse 95   Temp 97.9 F (36.6 C) (Oral)   Ht 5\' 3"  (1.6 m)   Wt 184 lb (83.5 kg)   SpO2 97%   BMI 32.59 kg/m   Subsequent Medicare wellness visit  1. Risk factors, based on past  M,S,F history.  [place] risk factors include history of TIA.  She now is on statin therapy.  She does have a history of mild dyslipidemia  2.  Physical activities: Sedentary but no activity restrictions.  3.  Depression/mood: No history major depression or mood disorder  4.  Hearing: No deficits  5.  ADL's: Independent  6.  Fall risk: Low  7.  Home safety: No issues identified  8.  Height weight, and visual acuity; height and weight stable no change in visual acuity  9.  Counseling: More vigorous exercise and modest weight loss all encouraged.  We will continue colonoscopies at 5-year intervals  10. Lab orders based on risk factors: Laboratory update including lipid profile will be reviewed  11. Referral :  None appropriate  at this time 12. Care plan: Continue efforts at aggressive risk factor modification.  Continue colonoscopies at 5-year intervals  13. Cognitive assessment: Alert and appropriate normal affect.  No cognitive dysfunction  14. Screening: Patient provided with a written and personalized 5-10 year screening schedule in the AVS.    15. Provider List Update: Primary care neurology and GI    Review of Systems  Constitutional: Negative.   HENT: Negative for congestion, dental problem, hearing loss, rhinorrhea, sinus pressure, sore throat and tinnitus.   Eyes: Negative for pain, discharge and visual disturbance.  Respiratory: Negative for cough and shortness of breath.   Cardiovascular: Negative for chest pain, palpitations and leg swelling.  Gastrointestinal: Negative for abdominal distention, abdominal pain, blood in stool, constipation, diarrhea, nausea and vomiting.  Genitourinary: Negative for difficulty urinating, dysuria, flank pain, frequency, hematuria, pelvic pain, urgency, vaginal bleeding, vaginal discharge and vaginal pain.  Musculoskeletal: Negative for arthralgias, gait problem and joint swelling.  Skin: Negative for rash.  Neurological: Negative for dizziness, syncope, speech difficulty, weakness, numbness and headaches.  Hematological: Negative for adenopathy.  Psychiatric/Behavioral: Negative for agitation, behavioral problems and dysphoric mood. The patient is not nervous/anxious.        Objective:   Physical Exam  Constitutional: She is oriented to person, place, and time. She appears well-developed and well-nourished.   Weight 180 4 repeat blood pressure 124/78  HENT:  Head: Normocephalic.  Right Ear: External ear normal.  Left Ear: External ear normal.  Mouth/Throat: Oropharynx is clear and moist.  Eyes: Pupils are equal, round, and reactive to light. Conjunctivae and EOM are normal.  Neck: Normal range of motion. Neck supple. No thyromegaly present.    Cardiovascular: Normal rate, regular rhythm, normal heart sounds and intact distal pulses.  Pedal pulses intact  Pulmonary/Chest: Effort normal and breath sounds normal.  Abdominal: Soft. Bowel sounds are normal. She exhibits no mass. There is no tenderness.  Musculoskeletal: Normal range of motion.  Lymphadenopathy:    She has no cervical adenopathy.  Neurological: She is alert and oriented to person, place, and time.  Skin: Skin is warm and dry. No rash noted.  Psychiatric: She has a normal mood and affect. Her behavior is normal.          Assessment & Plan:   Preventive health examination Subsequent Medicare wellness visit History of cerebrovascular  disease.  We will continue aspirin and statin therapy History mild dyslipidemia Overweight weight loss encouraged History of colonic polyps.  Continue colonoscopies at 5-year intervals  More rigorous exercise program a modest weight loss encouraged Follow-up in 6 to 12 months Review updated lab  [facility]

## 2017-08-09 NOTE — Patient Instructions (Signed)
Limit your sodium (Salt) intake  You need to lose weight.  Consider a lower calorie diet and regular exercise.    It is important that you exercise regularly, at least 20 minutes 3 to 4 times per week.  If you develop chest pain or shortness of breath seek  medical attention.  Return in one year for follow-up  

## 2017-08-10 LAB — HEPATITIS C ANTIBODY
Hepatitis C Ab: NONREACTIVE
SIGNAL TO CUT-OFF: 0.02 (ref <1.00)

## 2017-08-25 ENCOUNTER — Other Ambulatory Visit: Payer: Self-pay | Admitting: Internal Medicine

## 2017-08-25 NOTE — Telephone Encounter (Signed)
[facility] contacted regarding refill of [facility]. [facility] states that [facility] for a 90 day supply in [place]. Pt called and notified that she should have enough medication until July and additional refills were available at the pharmacy. Pt states the current medication bottle she had is empty but she will look to see if she misplaced the refill from 06/2017.

## 2017-08-25 NOTE — Telephone Encounter (Signed)
Copied from [place] [phone]. Topic: Quick Communication - Rx Refill/Question >> Aug 25, 2017 12:51 PM Cleaster Corin, NT wrote: Medication:atorvastatin (LIPITOR) 10 MG tablet [[id number]]   Has the patient contacted their pharmacy?yes (Agent: If no, request that the patient contact the pharmacy for the refill.) (Agent: If yes, when and what did the pharmacy advise?)  Preferred Pharmacy (with phone number or street name): [id number] [address], [place] [address] [place] [address] Phone: [phone] Fax: [phone]    Agent: Please be advised that RX refills may take up to 3 business days. We ask that you follow-up with your pharmacy.

## 2017-09-13 ENCOUNTER — Telehealth: Payer: Self-pay | Admitting: *Deleted

## 2017-09-13 NOTE — Telephone Encounter (Signed)
Copied from [place] [phone]. Topic: Quick Communication - Lab Results >> Sep 13, 2017 11:11 AM Scherrie Gerlach wrote: Pt would like results of labs done 5/28

## 2017-09-16 NOTE — Telephone Encounter (Signed)
Patient notified of results and verbalized understanding.  

## 2019-06-11 IMAGING — CT CT ANGIO HEAD
4 of 15 series · 18 of 47 positions shown · IV contrast (isovue)
Comparison: None.

CLINICAL DATA: Transient cerebral ischemia. Intermittent
right-sided numbness since [REDACTED].

EXAM:
CT ANGIOGRAPHY HEAD
TECHNIQUE: Multidetector CT imaging of the head was performed using the
standard protocol during bolus administration of intravenous
contrast. Multiplanar CT image reconstructions and MIPs were
obtained to evaluate the vascular anatomy.
CONTRAST:  80 mL Isovue 370

[Series 8: thin axial · axial · 0.41mm/px · z∈[-143,-24]mm · 8 of 155 slices shown]
[im 18/155  brain]
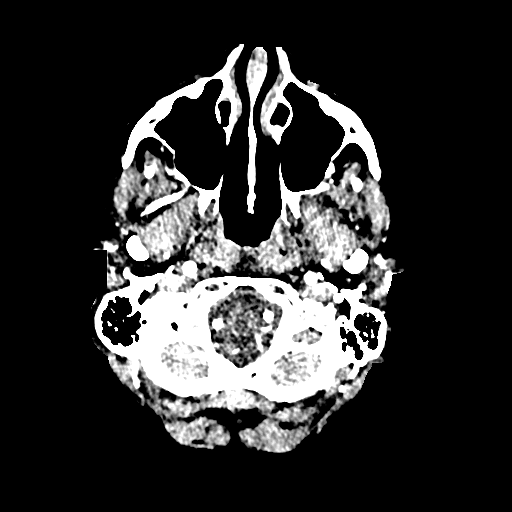
[im 35/155  bone]
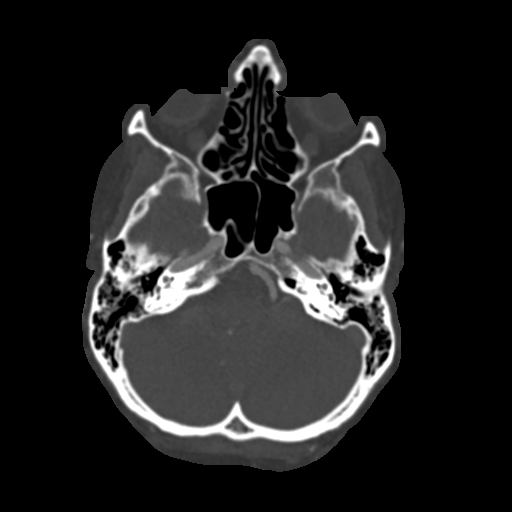
[im 52/155  brain]
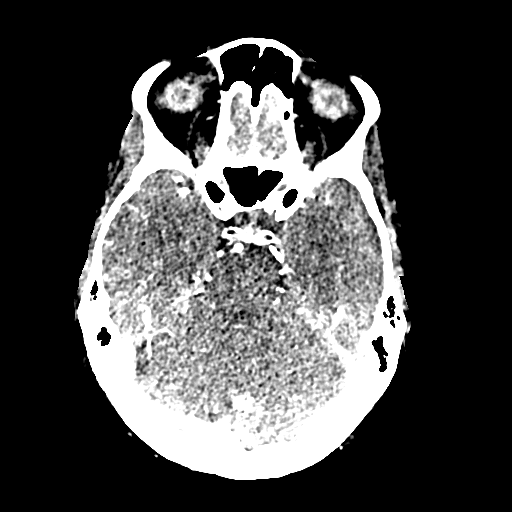
[im 69/155  bone]
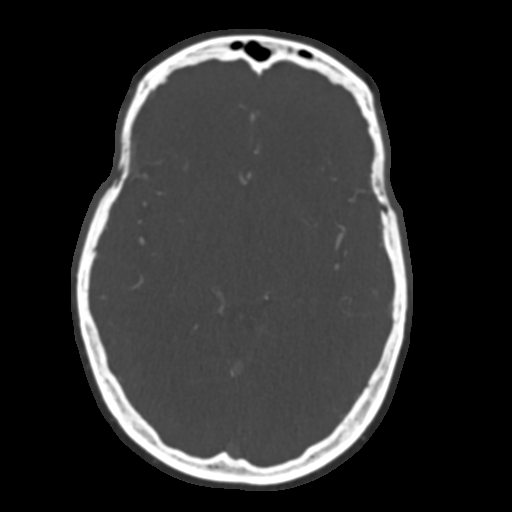
[im 86/155  brain]
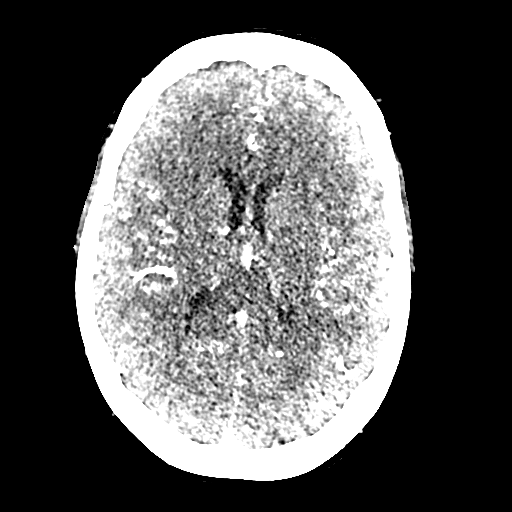
[im 103/155  bone]
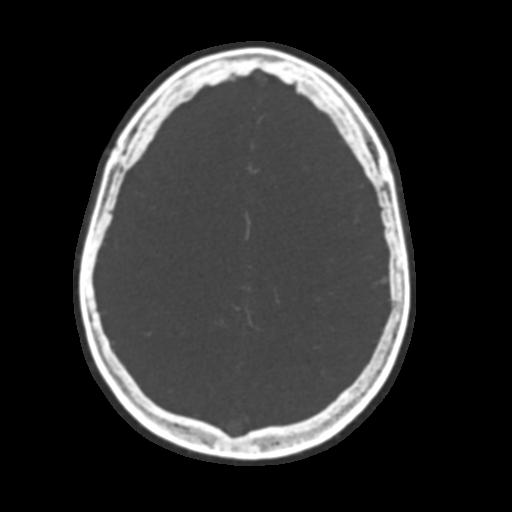
[im 120/155  brain]
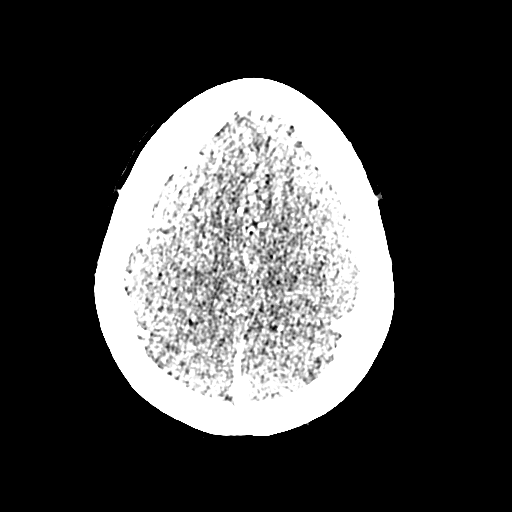
[im 137/155  bone]
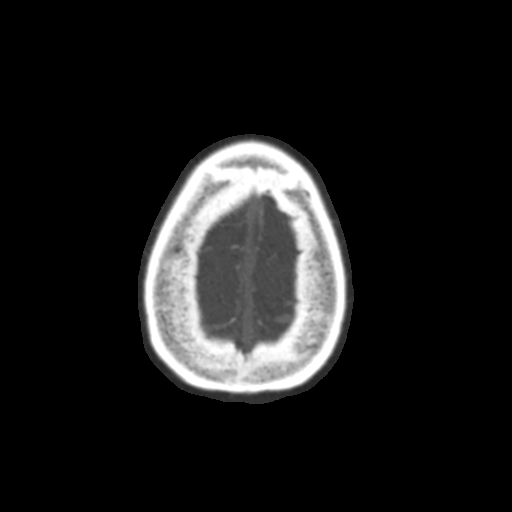

[Series 11: coronal thin · coronal · 0.30mm/px · 2 of 185 slices shown]
[im 62/185  brain]
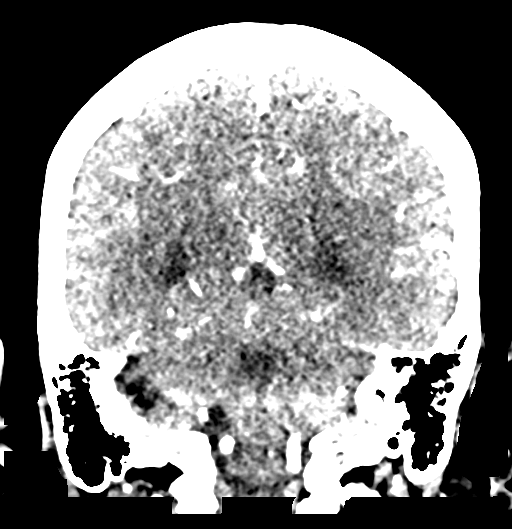
[im 123/185  brain]
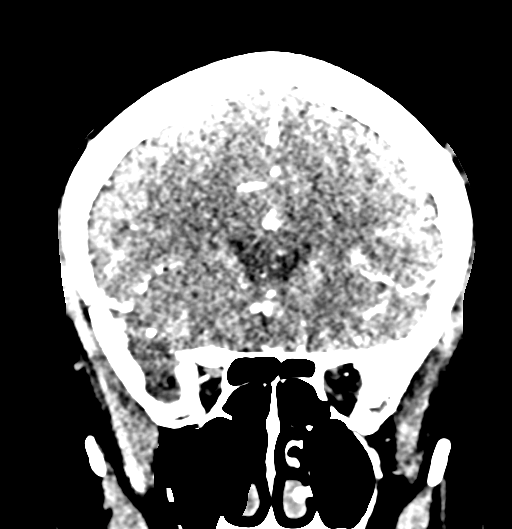

[Series 13: sagittal thin · sagittal · 0.30mm/px · 2 of 145 slices shown]
[im 49/145  brain]
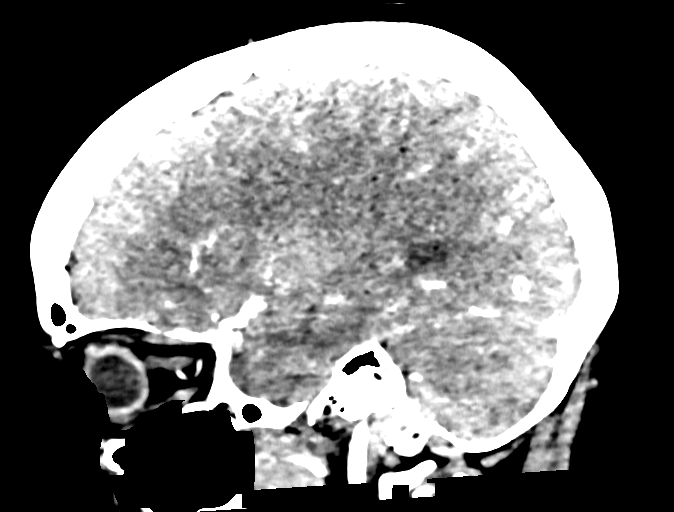
[im 97/145  brain]
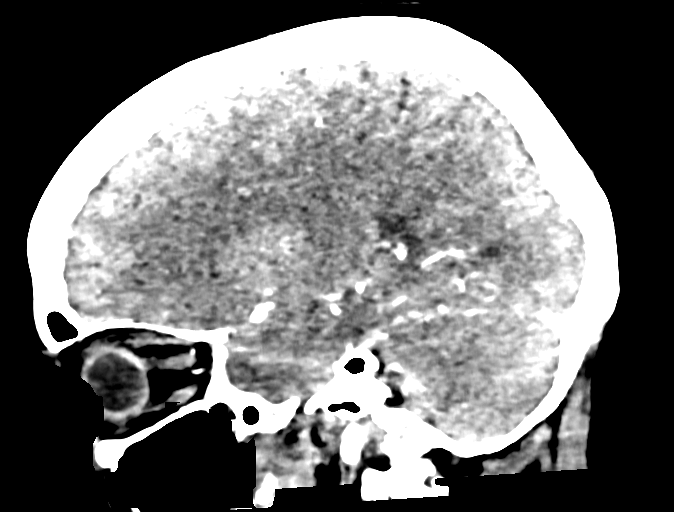

[Series 603: 1x1 axials · axial · 0.41mm/px · z∈[-116,-26]mm · 6 of 148 slices shown]
[im 19/148  brain]
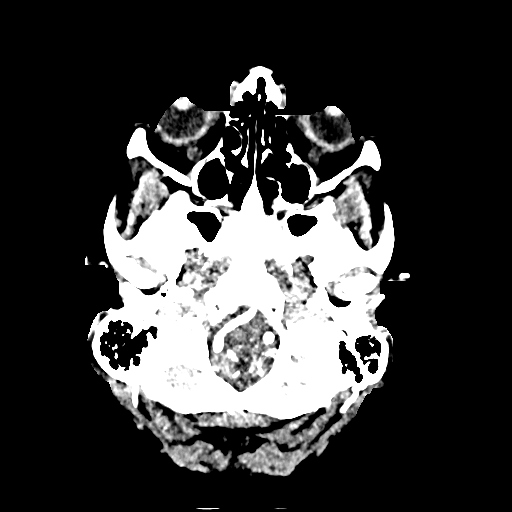
[im 37/148  brain]
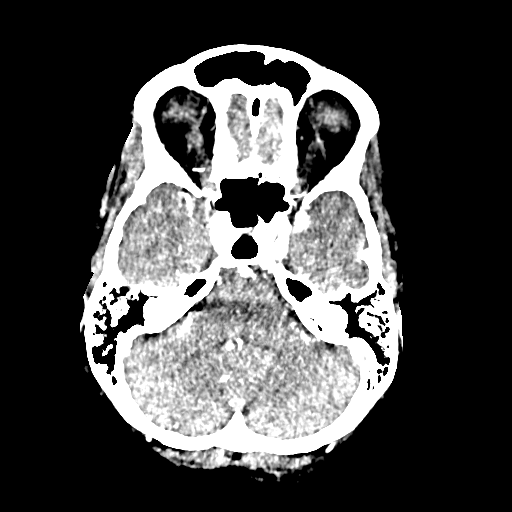
[im 56/148  brain]
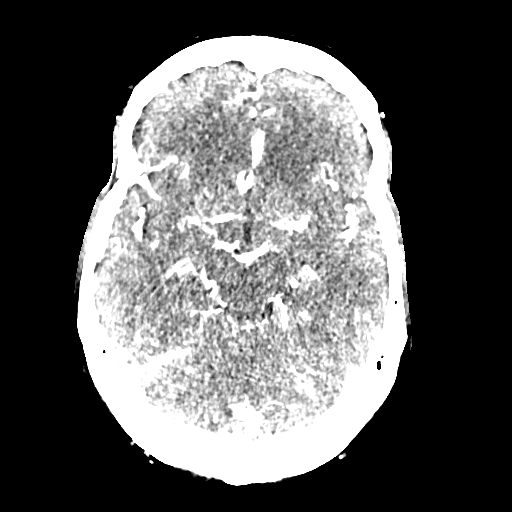
[im 74/148  brain]
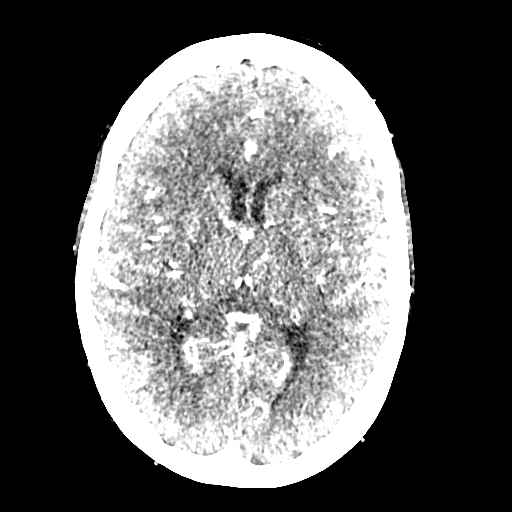
[im 92/148  brain]
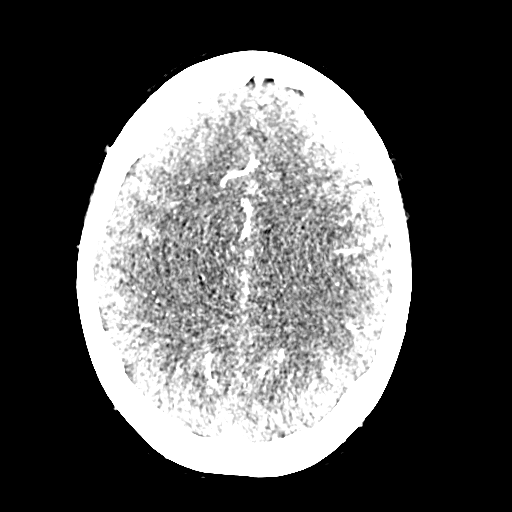
[im 111/148  brain]
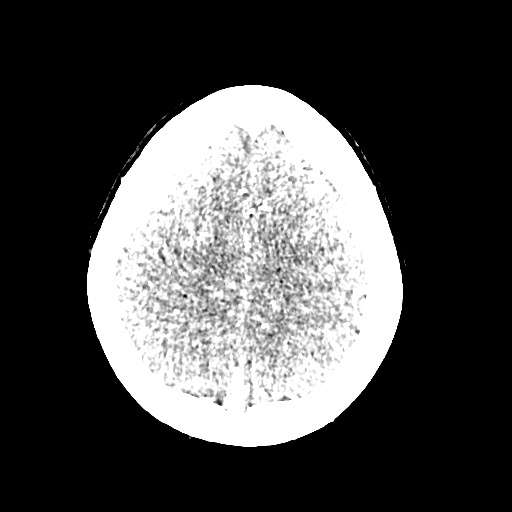

[18 of 47 positions shown; findings below may reference images not displayed]

FINDINGS: CT HEAD

Brain: There is no evidence of acute infarct, intracranial
hemorrhage, midline shift, or extra-axial fluid collection. The
ventricles and sulci are normal. There is a 6 mm mass along the
tuberculum sella which appears to be separate from the pituitary
gland.

Vascular: Mild calcified atherosclerosis at the skullbase.

Skull: No fracture focal osseous lesion.

Sinuses: Visualized paranasal sinuses and mastoid air cells are
clear.

Orbits: Unremarkable.

CTA HEAD

Anterior circulation: The internal carotid arteries are patent from
skullbase to carotid termini with mild siphon atherosclerosis and no
stenosis. A small infundibulum is noted at the left posterior
communicating artery origin. ACAs and MCAs are patent without
evidence of proximal branch occlusion or significant stenosis. No
aneurysm.

Posterior circulation: The visualized distal vertebral arteries are
widely patent to the basilar with the left being minimally larger
than the right. Patent AICA and SCA origins are visualized
bilaterally. The basilar artery is patent without significant
stenosis. There is mildly bulbous appearance of the distal basilar
artery at the level of the SCA and PCA origins without saccular
aneurysm. Patent posterior communicating arteries are seen
bilaterally. PCAs are patent without evidence of significant
stenosis.

Venous sinuses: Patent.

Anatomic variants: None.

Delayed phase: No abnormal enhancement. The tuberculum sellae mass
is not well seen due to its small size.
IMPRESSION: 1. Patent circle of Willis without evidence of significant stenosis
or proximal branch occlusion.
2. 6 mm mass along the tuberculum sella, most likely an incidental
meningioma. Pituitary protocol brain MRI could be performed for
further evaluation.
3. No evidence of acute intracranial abnormality.
